# Patient Record
Sex: Female | Born: 1965 | Race: Black or African American | Hispanic: No | Marital: Married | Smoking: Never smoker
Health system: Southern US, Community
[De-identification: ages and names within clinical notes are randomized; demographics above are authoritative.]

## PROBLEM LIST (undated history)

## (undated) DIAGNOSIS — J302 Other seasonal allergic rhinitis: Secondary | ICD-10-CM

## (undated) DIAGNOSIS — Z808 Family history of malignant neoplasm of other organs or systems: Secondary | ICD-10-CM

## (undated) DIAGNOSIS — Z923 Personal history of irradiation: Secondary | ICD-10-CM

## (undated) DIAGNOSIS — Z803 Family history of malignant neoplasm of breast: Secondary | ICD-10-CM

## (undated) DIAGNOSIS — E0590 Thyrotoxicosis, unspecified without thyrotoxic crisis or storm: Secondary | ICD-10-CM

## (undated) DIAGNOSIS — Z8042 Family history of malignant neoplasm of prostate: Secondary | ICD-10-CM

## (undated) DIAGNOSIS — Z9889 Other specified postprocedural states: Secondary | ICD-10-CM

## (undated) DIAGNOSIS — R112 Nausea with vomiting, unspecified: Secondary | ICD-10-CM

## (undated) HISTORY — PX: COLPOSCOPY: SHX161

## (undated) HISTORY — DX: Family history of malignant neoplasm of other organs or systems: Z80.8

## (undated) HISTORY — DX: Other seasonal allergic rhinitis: J30.2

## (undated) HISTORY — PX: COLONOSCOPY: SHX174

## (undated) HISTORY — DX: Family history of malignant neoplasm of breast: Z80.3

## (undated) HISTORY — DX: Family history of malignant neoplasm of prostate: Z80.42

## (undated) HISTORY — DX: Thyrotoxicosis, unspecified without thyrotoxic crisis or storm: E05.90

---

## 1999-02-04 ENCOUNTER — Other Ambulatory Visit: Admission: RE | Admit: 1999-02-04 | Discharge: 1999-02-04 | Payer: Self-pay | Admitting: Obstetrics & Gynecology

## 2000-02-05 ENCOUNTER — Other Ambulatory Visit: Admission: RE | Admit: 2000-02-05 | Discharge: 2000-02-05 | Payer: Self-pay | Admitting: Obstetrics & Gynecology

## 2001-02-03 ENCOUNTER — Ambulatory Visit (HOSPITAL_COMMUNITY): Admission: RE | Admit: 2001-02-03 | Discharge: 2001-02-03 | Payer: Self-pay | Admitting: Obstetrics and Gynecology

## 2001-02-03 ENCOUNTER — Encounter: Payer: Self-pay | Admitting: Obstetrics and Gynecology

## 2001-03-21 ENCOUNTER — Ambulatory Visit (HOSPITAL_COMMUNITY): Admission: RE | Admit: 2001-03-21 | Discharge: 2001-03-21 | Payer: Self-pay | Admitting: Obstetrics and Gynecology

## 2001-03-21 ENCOUNTER — Encounter: Payer: Self-pay | Admitting: Obstetrics and Gynecology

## 2001-06-24 ENCOUNTER — Inpatient Hospital Stay (HOSPITAL_COMMUNITY): Admission: AD | Admit: 2001-06-24 | Discharge: 2001-06-26 | Payer: Self-pay | Admitting: Obstetrics & Gynecology

## 2001-07-17 ENCOUNTER — Other Ambulatory Visit: Admission: RE | Admit: 2001-07-17 | Discharge: 2001-07-17 | Payer: Self-pay | Admitting: Obstetrics & Gynecology

## 2002-07-18 ENCOUNTER — Other Ambulatory Visit: Admission: RE | Admit: 2002-07-18 | Discharge: 2002-07-18 | Payer: Self-pay | Admitting: Obstetrics & Gynecology

## 2003-08-14 ENCOUNTER — Other Ambulatory Visit: Admission: RE | Admit: 2003-08-14 | Discharge: 2003-08-14 | Payer: Self-pay | Admitting: Obstetrics & Gynecology

## 2004-08-17 ENCOUNTER — Other Ambulatory Visit: Admission: RE | Admit: 2004-08-17 | Discharge: 2004-08-17 | Payer: Self-pay | Admitting: Obstetrics & Gynecology

## 2006-08-07 ENCOUNTER — Emergency Department (HOSPITAL_COMMUNITY): Admission: EM | Admit: 2006-08-07 | Discharge: 2006-08-07 | Payer: Self-pay | Admitting: Emergency Medicine

## 2010-02-02 ENCOUNTER — Encounter: Payer: Self-pay | Admitting: Internal Medicine

## 2010-06-22 ENCOUNTER — Other Ambulatory Visit: Payer: Self-pay | Admitting: Internal Medicine

## 2010-06-22 DIAGNOSIS — Z1231 Encounter for screening mammogram for malignant neoplasm of breast: Secondary | ICD-10-CM

## 2010-07-06 ENCOUNTER — Ambulatory Visit
Admission: RE | Admit: 2010-07-06 | Discharge: 2010-07-06 | Disposition: A | Discharge status: Home / Self Care | Payer: 59 | Source: Ambulatory Visit | Attending: Internal Medicine | Admitting: Internal Medicine

## 2010-07-06 DIAGNOSIS — Z1231 Encounter for screening mammogram for malignant neoplasm of breast: Secondary | ICD-10-CM

## 2010-09-02 ENCOUNTER — Telehealth (INDEPENDENT_AMBULATORY_CARE_PROVIDER_SITE_OTHER): Payer: Self-pay

## 2010-10-02 NOTE — Telephone Encounter (Signed)
Encounter resolved.

## 2011-06-21 ENCOUNTER — Other Ambulatory Visit: Payer: Self-pay | Admitting: Internal Medicine

## 2011-06-21 DIAGNOSIS — Z1231 Encounter for screening mammogram for malignant neoplasm of breast: Secondary | ICD-10-CM

## 2011-07-12 ENCOUNTER — Ambulatory Visit
Admission: RE | Admit: 2011-07-12 | Discharge: 2011-07-12 | Disposition: A | Discharge status: Home / Self Care | Payer: 59 | Source: Ambulatory Visit | Attending: Internal Medicine | Admitting: Internal Medicine

## 2011-07-12 DIAGNOSIS — Z1231 Encounter for screening mammogram for malignant neoplasm of breast: Secondary | ICD-10-CM

## 2012-06-19 ENCOUNTER — Other Ambulatory Visit: Payer: Self-pay

## 2012-06-19 DIAGNOSIS — Z1231 Encounter for screening mammogram for malignant neoplasm of breast: Secondary | ICD-10-CM

## 2012-08-24 ENCOUNTER — Ambulatory Visit
Admission: RE | Admit: 2012-08-24 | Discharge: 2012-08-24 | Disposition: A | Discharge status: Home / Self Care | Payer: 59 | Source: Ambulatory Visit

## 2012-08-24 DIAGNOSIS — Z1231 Encounter for screening mammogram for malignant neoplasm of breast: Secondary | ICD-10-CM

## 2013-10-02 ENCOUNTER — Other Ambulatory Visit: Payer: Self-pay

## 2013-10-02 DIAGNOSIS — Z1231 Encounter for screening mammogram for malignant neoplasm of breast: Secondary | ICD-10-CM

## 2013-10-15 ENCOUNTER — Ambulatory Visit
Admission: RE | Admit: 2013-10-15 | Discharge: 2013-10-15 | Disposition: A | Discharge status: Home / Self Care | Payer: 59 | Source: Ambulatory Visit

## 2013-10-15 DIAGNOSIS — Z1231 Encounter for screening mammogram for malignant neoplasm of breast: Secondary | ICD-10-CM

## 2014-10-07 ENCOUNTER — Other Ambulatory Visit: Payer: Self-pay

## 2014-10-07 DIAGNOSIS — Z1231 Encounter for screening mammogram for malignant neoplasm of breast: Secondary | ICD-10-CM

## 2014-10-21 ENCOUNTER — Ambulatory Visit
Admission: RE | Admit: 2014-10-21 | Discharge: 2014-10-21 | Disposition: A | Discharge status: Home / Self Care | Payer: 59 | Source: Ambulatory Visit

## 2014-10-21 DIAGNOSIS — Z1231 Encounter for screening mammogram for malignant neoplasm of breast: Secondary | ICD-10-CM

## 2015-10-16 ENCOUNTER — Other Ambulatory Visit: Payer: Self-pay | Admitting: Internal Medicine

## 2015-10-16 DIAGNOSIS — Z1231 Encounter for screening mammogram for malignant neoplasm of breast: Secondary | ICD-10-CM

## 2015-10-28 ENCOUNTER — Ambulatory Visit
Admission: RE | Admit: 2015-10-28 | Discharge: 2015-10-28 | Disposition: A | Discharge status: Home / Self Care | Payer: Managed Care, Other (non HMO) | Source: Ambulatory Visit | Attending: Internal Medicine | Admitting: Internal Medicine

## 2015-10-28 DIAGNOSIS — Z1231 Encounter for screening mammogram for malignant neoplasm of breast: Secondary | ICD-10-CM

## 2016-10-21 ENCOUNTER — Other Ambulatory Visit: Payer: Self-pay | Admitting: Internal Medicine

## 2016-10-21 DIAGNOSIS — Z1231 Encounter for screening mammogram for malignant neoplasm of breast: Secondary | ICD-10-CM

## 2016-12-06 ENCOUNTER — Ambulatory Visit
Admission: RE | Admit: 2016-12-06 | Discharge: 2016-12-06 | Disposition: A | Discharge status: Home / Self Care | Payer: Managed Care, Other (non HMO) | Source: Ambulatory Visit | Attending: Internal Medicine | Admitting: Internal Medicine

## 2016-12-06 DIAGNOSIS — Z1231 Encounter for screening mammogram for malignant neoplasm of breast: Secondary | ICD-10-CM

## 2017-11-04 ENCOUNTER — Other Ambulatory Visit: Payer: Self-pay | Admitting: Internal Medicine

## 2017-11-04 DIAGNOSIS — Z1231 Encounter for screening mammogram for malignant neoplasm of breast: Secondary | ICD-10-CM

## 2017-12-26 ENCOUNTER — Ambulatory Visit
Admission: RE | Admit: 2017-12-26 | Discharge: 2017-12-26 | Disposition: A | Discharge status: Home / Self Care | Payer: Managed Care, Other (non HMO) | Source: Ambulatory Visit | Attending: Internal Medicine | Admitting: Internal Medicine

## 2017-12-26 DIAGNOSIS — Z1231 Encounter for screening mammogram for malignant neoplasm of breast: Secondary | ICD-10-CM

## 2018-01-30 ENCOUNTER — Encounter: Payer: Self-pay | Admitting: Internal Medicine

## 2018-01-30 ENCOUNTER — Ambulatory Visit: Payer: 59 | Admitting: Internal Medicine

## 2018-01-30 VITALS — BP 112/74 | HR 76 | Temp 97.7°F | Ht 67.0 in | Wt 197.6 lb

## 2018-01-30 DIAGNOSIS — Z0000 Encounter for general adult medical examination without abnormal findings: Secondary | ICD-10-CM | POA: Diagnosis not present

## 2018-01-30 LAB — CMP14+EGFR
ALBUMIN: 4.2 g/dL (ref 3.8–4.9)
ALT: 19 IU/L (ref 0–32)
AST: 15 IU/L (ref 0–40)
Albumin/Globulin Ratio: 1.6 (ref 1.2–2.2)
Alkaline Phosphatase: 86 IU/L (ref 39–117)
BUN / CREAT RATIO: 12 (ref 9–23)
BUN: 10 mg/dL (ref 6–24)
Bilirubin Total: 0.2 mg/dL (ref 0.0–1.2)
CALCIUM: 8.8 mg/dL (ref 8.7–10.2)
CO2: 21 mmol/L (ref 20–29)
CREATININE: 0.85 mg/dL (ref 0.57–1.00)
Chloride: 101 mmol/L (ref 96–106)
GFR calc non Af Amer: 80 mL/min/{1.73_m2} (ref >59)
GFR, EST AFRICAN AMERICAN: 92 mL/min/{1.73_m2} (ref >59)
GLOBULIN, TOTAL: 2.7 g/dL (ref 1.5–4.5)
Glucose: 81 mg/dL (ref 65–99)
Potassium: 3.9 mmol/L (ref 3.5–5.2)
SODIUM: 136 mmol/L (ref 134–144)
TOTAL PROTEIN: 6.9 g/dL (ref 6.0–8.5)

## 2018-01-30 LAB — CBC
Hematocrit: 37.8 % (ref 34.0–46.6)
Hemoglobin: 12.5 g/dL (ref 11.1–15.9)
MCH: 28 pg (ref 26.6–33.0)
MCHC: 33.1 g/dL (ref 31.5–35.7)
MCV: 85 fL (ref 79–97)
PLATELETS: 273 10*3/uL (ref 150–450)
RBC: 4.47 x10E6/uL (ref 3.77–5.28)
RDW: 12.5 % (ref 11.7–15.4)
WBC: 7.2 10*3/uL (ref 3.4–10.8)

## 2018-01-30 LAB — LIPID PANEL
Chol/HDL Ratio: 3 ratio (ref 0.0–4.4)
Cholesterol, Total: 210 mg/dL — ABNORMAL HIGH (ref 100–199)
HDL: 71 mg/dL (ref >39)
LDL Calculated: 126 mg/dL — ABNORMAL HIGH (ref 0–99)
Triglycerides: 63 mg/dL (ref 0–149)
VLDL CHOLESTEROL CAL: 13 mg/dL (ref 5–40)

## 2018-01-30 LAB — HEMOGLOBIN A1C
Est. average glucose Bld gHb Est-mCnc: 103 mg/dL
Hgb A1c MFr Bld: 5.2 % (ref 4.8–5.6)

## 2018-01-30 NOTE — Progress Notes (Signed)
Subjective:     Patient ID: Cassandra Torres , female    DOB: 27-Aug-1965 , 53 y.o.   MRN: [id number]   Chief Complaint  Patient presents with  . Annual Exam    HPI  She is here today for a full physical examination. Her last pap smear was in 2018. She has no specific concerns or complaints at this time.     Past Medical History:  Diagnosis Date  . Seasonal allergies   . Thyrotoxicosis      Family History  Problem Relation Age of Onset  . Hypertension Mother   . Healthy Father      Current Outpatient Medications:  .  Cholecalciferol (VITAMIN D) 50 MCG (2000 UT) CAPS, Take by mouth., Disp: , Rfl:  .  loratadine (CLARITIN) 10 MG tablet, Take by mouth., Disp: , Rfl:  .  hydroxychloroquine (PLAQUENIL) 200 MG tablet, Take by mouth., Disp: , Rfl:    Allergies  Allergen Reactions  . Penicillins Hives  . Sulfur Hives      Patient's last menstrual period was 01/23/2018..  Negative for: breast discharge, breast lump(s), breast pain and breast self exam. Associated symptoms include abnormal vaginal bleeding. Pertinent negatives include abnormal bleeding (hematology), anxiety, decreased libido, depression, difficulty falling sleep, dyspareunia, history of infertility, nocturia, sexual dysfunction, sleep disturbances, urinary incontinence, urinary urgency, vaginal discharge and vaginal itching. Diet regular.The patient states her exercise level is  minimal.  . The patient's tobacco use is:  Social History   Tobacco Use  Smoking Status Never Smoker  Smokeless Tobacco Never Used  . She has been exposed to passive smoke. The patient's alcohol use is:  Social History   Substance and Sexual Activity  Alcohol Use Yes   Comment: occasional  . Additional information: Last pap Dec 2018, next one scheduled for 2021.   Review of Systems  Constitutional: Negative.   HENT: Negative.   Eyes: Negative.   Respiratory: Negative.   Cardiovascular: Negative.   Gastrointestinal: Negative.    Endocrine: Negative.   Genitourinary: Negative.   Musculoskeletal: Negative.   Skin: Negative.   Allergic/Immunologic: Negative.   Neurological: Negative.   Hematological: Negative.   Psychiatric/Behavioral: Negative.      Today's Vitals   01/30/18 1006  BP: 112/74  Pulse: 76  Temp: 97.7 F (36.5 C)  TempSrc: Oral  Weight: 197 lb 9.6 oz (89.6 kg)  Height: 5' 7"  (1.702 m)   Body mass index is 30.95 kg/m.   Objective:  Physical Exam Vitals signs and nursing note reviewed.  Constitutional:      Appearance: Normal appearance. She is obese.  HENT:     Head: Normocephalic and atraumatic.     Right Ear: Tympanic membrane, ear canal and external ear normal.     Left Ear: Tympanic membrane, ear canal and external ear normal.     Nose: Nose normal.     Mouth/Throat:     Mouth: Mucous membranes are moist.     Pharynx: Oropharynx is clear.  Eyes:     Extraocular Movements: Extraocular movements intact.     Conjunctiva/sclera: Conjunctivae normal.     Pupils: Pupils are equal, round, and reactive to light.  Cardiovascular:     Rate and Rhythm: Normal rate and regular rhythm.     Pulses: Normal pulses.     Heart sounds: Normal heart sounds.  Pulmonary:     Effort: Pulmonary effort is normal.     Breath sounds: Normal breath sounds.  Chest:  Breasts:        Right: Normal. No swelling, bleeding, inverted nipple, mass, nipple discharge or skin change.        Left: Normal. No swelling, bleeding, inverted nipple, mass, nipple discharge or skin change.  Abdominal:     General: Bowel sounds are normal.     Palpations: Abdomen is soft.  Genitourinary:    Comments: deferred Musculoskeletal: Normal range of motion.  Skin:    General: Skin is warm and dry.     Capillary Refill: Capillary refill takes less than 2 seconds.  Neurological:     General: No focal deficit present.     Mental Status: She is alert.  Psychiatric:        Mood and Affect: Mood normal.          Assessment And Plan:     1. Routine general medical examination at health care facility  A full exam was performed.  Importance of monthly self breast exam was discussed with the patient. PATIENT HAS BEEN ADVISED TO GET 30-45 MINUTES REGULAR EXERCISE NO LESS THAN FOUR TO FIVE DAYS PER WEEK - BOTH WEIGHTBEARING EXERCISES AND AEROBIC ARE RECOMMENDED.  SHE IS ADVISED TO FOLLOW A HEALTHY DIET WITH AT LEAST SIX FRUITS/VEGGIES PER DAY, DECREASE INTAKE OF RED MEAT, AND TO INCREASE FISH INTAKE TO TWO DAYS PER WEEK.  MEATS/FISH SHOULD NOT BE FRIED, BAKED OR BROILED IS PREFERABLE.  I SUGGEST WEARING SPF 50 SUNSCREEN ON EXPOSED PARTS AND ESPECIALLY WHEN IN THE DIRECT SUNLIGHT FOR AN EXTENDED PERIOD OF TIME.  PLEASE AVOID FAST FOOD RESTAURANTS AND INCREASE YOUR WATER INTAKE.  - CMP14+EGFR - CBC - Lipid panel - Hemoglobin A1c        Maximino Greenland, MD

## 2018-01-30 NOTE — Patient Instructions (Signed)

## 2018-05-01 ENCOUNTER — Other Ambulatory Visit: Payer: Self-pay

## 2018-05-01 MED ORDER — HYDROXYCHLOROQUINE SULFATE 200 MG PO TABS: 200.0000 mg | ORAL_TABLET | Freq: Every day | ORAL | 0 refills | Status: DC

## 2018-05-02 ENCOUNTER — Other Ambulatory Visit: Payer: Self-pay

## 2018-05-02 MED ORDER — HYDROXYCHLOROQUINE SULFATE 200 MG PO TABS: ORAL_TABLET | ORAL | 0 refills | Status: DC

## 2018-08-04 ENCOUNTER — Other Ambulatory Visit: Payer: Self-pay | Admitting: Internal Medicine

## 2018-11-05 ENCOUNTER — Other Ambulatory Visit: Payer: Self-pay | Admitting: Internal Medicine

## 2018-12-12 ENCOUNTER — Other Ambulatory Visit: Payer: Self-pay | Admitting: Internal Medicine

## 2018-12-12 DIAGNOSIS — Z1231 Encounter for screening mammogram for malignant neoplasm of breast: Secondary | ICD-10-CM

## 2019-02-01 ENCOUNTER — Ambulatory Visit
Admission: RE | Admit: 2019-02-01 | Discharge: 2019-02-01 | Disposition: A | Discharge status: Home / Self Care | Payer: Managed Care, Other (non HMO) | Source: Ambulatory Visit | Attending: Internal Medicine | Admitting: Internal Medicine

## 2019-02-01 ENCOUNTER — Other Ambulatory Visit: Payer: Self-pay

## 2019-02-01 DIAGNOSIS — Z1231 Encounter for screening mammogram for malignant neoplasm of breast: Secondary | ICD-10-CM

## 2019-02-06 ENCOUNTER — Other Ambulatory Visit: Payer: Self-pay | Admitting: Internal Medicine

## 2019-02-06 DIAGNOSIS — R928 Other abnormal and inconclusive findings on diagnostic imaging of breast: Secondary | ICD-10-CM

## 2019-02-12 ENCOUNTER — Other Ambulatory Visit (HOSPITAL_COMMUNITY)
Admission: RE | Admit: 2019-02-12 | Discharge: 2019-02-12 | Disposition: A | Discharge status: Home / Self Care | Payer: Managed Care, Other (non HMO) | Source: Ambulatory Visit | Attending: Internal Medicine | Admitting: Internal Medicine

## 2019-02-12 ENCOUNTER — Ambulatory Visit: Payer: Managed Care, Other (non HMO) | Admitting: Internal Medicine

## 2019-02-12 ENCOUNTER — Encounter: Payer: Self-pay | Admitting: Internal Medicine

## 2019-02-12 ENCOUNTER — Other Ambulatory Visit: Payer: Self-pay

## 2019-02-12 ENCOUNTER — Ambulatory Visit: Payer: 59 | Admitting: Internal Medicine

## 2019-02-12 VITALS — BP 108/74 | HR 94 | Temp 98.5°F | Ht 67.4 in | Wt 200.2 lb

## 2019-02-12 DIAGNOSIS — R928 Other abnormal and inconclusive findings on diagnostic imaging of breast: Secondary | ICD-10-CM

## 2019-02-12 DIAGNOSIS — Z6830 Body mass index (BMI) 30.0-30.9, adult: Secondary | ICD-10-CM

## 2019-02-12 DIAGNOSIS — Z0000 Encounter for general adult medical examination without abnormal findings: Secondary | ICD-10-CM

## 2019-02-12 DIAGNOSIS — E6609 Other obesity due to excess calories: Secondary | ICD-10-CM

## 2019-02-12 DIAGNOSIS — R7989 Other specified abnormal findings of blood chemistry: Secondary | ICD-10-CM

## 2019-02-12 DIAGNOSIS — Z712 Person consulting for explanation of examination or test findings: Secondary | ICD-10-CM

## 2019-02-12 DIAGNOSIS — Z01419 Encounter for gynecological examination (general) (routine) without abnormal findings: Secondary | ICD-10-CM | POA: Insufficient documentation

## 2019-02-12 DIAGNOSIS — Z1211 Encounter for screening for malignant neoplasm of colon: Secondary | ICD-10-CM

## 2019-02-12 DIAGNOSIS — C801 Malignant (primary) neoplasm, unspecified: Secondary | ICD-10-CM

## 2019-02-12 HISTORY — DX: Malignant (primary) neoplasm, unspecified: C80.1

## 2019-02-12 LAB — POCT URINALYSIS DIPSTICK
Bilirubin, UA: NEGATIVE
Glucose, UA: NEGATIVE
Ketones, UA: NEGATIVE
Nitrite, UA: NEGATIVE
Protein, UA: NEGATIVE
Spec Grav, UA: >=1.03 — AB (ref 1.010–1.025)
Urobilinogen, UA: 0.2 E.U./dL
pH, UA: 5.5 (ref 5.0–8.0)

## 2019-02-12 LAB — POC HEMOCCULT BLD/STL (OFFICE/1-CARD/DIAGNOSTIC)
Card #1 Date: 2012021
Fecal Occult Blood, POC: NEGATIVE

## 2019-02-12 NOTE — Patient Instructions (Signed)
Health Maintenance, Female Adopting a healthy lifestyle and getting preventive care are important in promoting health and wellness. Ask your health care provider about:  The right schedule for you to have regular tests and exams.  Things you can do on your own to prevent diseases and keep yourself healthy. What should I know about diet, weight, and exercise? Eat a healthy diet   Eat a diet that includes plenty of vegetables, fruits, low-fat dairy products, and lean protein.  Do not eat a lot of foods that are high in solid fats, added sugars, or sodium. Maintain a healthy weight Body mass index (BMI) is used to identify weight problems. It estimates body fat based on height and weight. Your health care provider can help determine your BMI and help you achieve or maintain a healthy weight. Get regular exercise Get regular exercise. This is one of the most important things you can do for your health. Most adults should:  Exercise for at least 150 minutes each week. The exercise should increase your heart rate and make you sweat (moderate-intensity exercise).  Do strengthening exercises at least twice a week. This is in addition to the moderate-intensity exercise.  Spend less time sitting. Even light physical activity can be beneficial. Watch cholesterol and blood lipids Have your blood tested for lipids and cholesterol at 53 years of age, then have this test every 5 years. Have your cholesterol levels checked more often if:  Your lipid or cholesterol levels are high.  You are older than 53 years of age.  You are at high risk for heart disease. What should I know about cancer screening? Depending on your health history and family history, you may need to have cancer screening at various ages. This may include screening for:  Breast cancer.  Cervical cancer.  Colorectal cancer.  Skin cancer.  Lung cancer. What should I know about heart disease, diabetes, and high blood  pressure? Blood pressure and heart disease  High blood pressure causes heart disease and increases the risk of stroke. This is more likely to develop in people who have high blood pressure readings, are of African descent, or are overweight.  Have your blood pressure checked: ? Every 3-5 years if you are 18-39 years of age. ? Every year if you are 40 years old or older. Diabetes Have regular diabetes screenings. This checks your fasting blood sugar level. Have the screening done:  Once every three years after age 40 if you are at a normal weight and have a low risk for diabetes.  More often and at a younger age if you are overweight or have a high risk for diabetes. What should I know about preventing infection? Hepatitis B If you have a higher risk for hepatitis B, you should be screened for this virus. Talk with your health care provider to find out if you are at risk for hepatitis B infection. Hepatitis C Testing is recommended for:  Everyone born from 1945 through 1965.  Anyone with known risk factors for hepatitis C. Sexually transmitted infections (STIs)  Get screened for STIs, including gonorrhea and chlamydia, if: ? You are sexually active and are younger than 53 years of age. ? You are older than 53 years of age and your health care provider tells you that you are at risk for this type of infection. ? Your sexual activity has changed since you were last screened, and you are at increased risk for chlamydia or gonorrhea. Ask your health care provider if   you are at risk.  Ask your health care provider about whether you are at high risk for HIV. Your health care provider may recommend a prescription medicine to help prevent HIV infection. If you choose to take medicine to prevent HIV, you should first get tested for HIV. You should then be tested every 3 months for as long as you are taking the medicine. Pregnancy  If you are about to stop having your period (premenopausal) and  you may become pregnant, seek counseling before you get pregnant.  Take 400 to 800 micrograms (mcg) of folic acid every day if you become pregnant.  Ask for birth control (contraception) if you want to prevent pregnancy. Osteoporosis and menopause Osteoporosis is a disease in which the bones lose minerals and strength with aging. This can result in bone fractures. If you are 65 years old or older, or if you are at risk for osteoporosis and fractures, ask your health care provider if you should:  Be screened for bone loss.  Take a calcium or vitamin D supplement to lower your risk of fractures.  Be given hormone replacement therapy (HRT) to treat symptoms of menopause. Follow these instructions at home: Lifestyle  Do not use any products that contain nicotine or tobacco, such as cigarettes, e-cigarettes, and chewing tobacco. If you need help quitting, ask your health care provider.  Do not use street drugs.  Do not share needles.  Ask your health care provider for help if you need support or information about quitting drugs. Alcohol use  Do not drink alcohol if: ? Your health care provider tells you not to drink. ? You are pregnant, may be pregnant, or are planning to become pregnant.  If you drink alcohol: ? Limit how much you use to 0-1 drink a day. ? Limit intake if you are breastfeeding.  Be aware of how much alcohol is in your drink. In the [place] one drink equals one 12 oz bottle of beer (355 mL), one 5 oz glass of wine (148 mL), or one 1 oz glass of hard liquor (44 mL). General instructions  Schedule regular health, dental, and eye exams.  Stay current with your vaccines.  Tell your health care provider if: ? You often feel depressed. ? You have ever been abused or do not feel safe at home. Summary  Adopting a healthy lifestyle and getting preventive care are important in promoting health and wellness.  Follow your health care provider's instructions about healthy  diet, exercising, and getting tested or screened for diseases.  Follow your health care provider's instructions on monitoring your cholesterol and blood pressure. This information is not intended to replace advice given to you by your health care provider. Make sure you discuss any questions you have with your health care provider. Document Revised: 12/21/2017 Document Reviewed: 12/21/2017 Elsevier Patient Education  2020 [employer]  

## 2019-02-12 NOTE — Progress Notes (Addendum)
This visit occurred during the SARS-CoV-2 public health emergency.  Safety protocols were in place, including screening questions prior to the visit, additional usage of staff PPE, and extensive cleaning of exam room while observing appropriate contact time as indicated for disinfecting solutions.  Subjective:     Patient ID: Cassandra Torres , female    DOB: 04-21-1965 , 54 y.o.   MRN: [id number]   Chief Complaint  Patient presents with  . Annual Exam    HPI  She is here today for a full physical examination. She would like to have a pap smear today. She recently had mammogram which was abnormal. She is scheduled for diagnostic mammo and u/s later this week. She has not palpated any abnormalities.     Past Medical History:  Diagnosis Date  . Seasonal allergies   . Thyrotoxicosis      Family History  Problem Relation Age of Onset  . Hypertension Mother   . Healthy Father      Current Outpatient Medications:  .  hydroxychloroquine (PLAQUENIL) 200 MG tablet, TAKE 1 TABLET BY MOUTH DAILY DX: L56.8, Disp: 90 tablet, Rfl: 0 .  loratadine (CLARITIN) 10 MG tablet, Take by mouth., Disp: , Rfl:  .  Multiple Vitamin (MULTIVITAMINS PO), Take by mouth., Disp: , Rfl:    Allergies  Allergen Reactions  . Penicillins Hives  . Sulfur Hives     The patient states she uses none for birth control. Last LMP was No LMP recorded.. Negative for Dysmenorrhea Negative for: breast discharge, breast lump(s), breast pain and breast self exam. Associated symptoms include abnormal vaginal bleeding. Pertinent negatives include abnormal bleeding (hematology), anxiety, decreased libido, depression, difficulty falling sleep, dyspareunia, history of infertility, nocturia, sexual dysfunction, sleep disturbances, urinary incontinence, urinary urgency, vaginal discharge and vaginal itching. Diet regular.The patient states her exercise level is  minimal.   . The patient's tobacco use is:  Social History   Tobacco  Use  Smoking Status Never Smoker  Smokeless Tobacco Never Used  . She has been exposed to passive smoke. The patient's alcohol use is:  Social History   Substance and Sexual Activity  Alcohol Use Yes   Comment: occasional    Review of Systems  Constitutional: Negative.   HENT: Negative.   Eyes: Negative.   Respiratory: Negative.   Cardiovascular: Negative.   Endocrine: Negative.   Genitourinary: Negative.   Musculoskeletal: Negative.   Skin: Negative.   Allergic/Immunologic: Negative.   Neurological: Negative.   Hematological: Negative.   Psychiatric/Behavioral: Negative.      Today's Vitals   02/12/19 1012  BP: 108/74  Pulse: 94  Temp: 98.5 F (36.9 C)  TempSrc: Oral  Weight: 200 lb 3.2 oz (90.8 kg)  Height: 5' 7.4" (1.712 m)   Body mass index is 30.98 kg/m.   Objective:  Physical Exam Vitals and nursing note reviewed. Exam conducted with a chaperone present.  Constitutional:      Appearance: Normal appearance.  HENT:     Head: Normocephalic and atraumatic.     Right Ear: Tympanic membrane, ear canal and external ear normal.     Left Ear: Tympanic membrane, ear canal and external ear normal.     Nose:     Comments: Deferred, masked    Mouth/Throat:     Comments: Deferred, masked Eyes:     Extraocular Movements: Extraocular movements intact.     Conjunctiva/sclera: Conjunctivae normal.     Pupils: Pupils are equal, round, and reactive to light.  Cardiovascular:  Rate and Rhythm: Normal rate and regular rhythm.     Pulses: Normal pulses.          Dorsalis pedis pulses are 2+ on the right side and 2+ on the left side.     Heart sounds: Normal heart sounds.  Pulmonary:     Effort: Pulmonary effort is normal.     Breath sounds: Normal breath sounds.  Chest:     Breasts: Tanner Score is 5.        Right: Normal.        Left: Normal.     Comments: Dense breasts b/l; Questionable discrete masses on r Abdominal:     General: Abdomen is flat. Bowel  sounds are normal.     Palpations: Abdomen is soft.     Hernia: There is no hernia in the left inguinal area or right inguinal area.  Genitourinary:    General: Normal vulva.     Exam position: Lithotomy position.     Tanner stage (genital): 5.     Vagina: Normal.     Cervix: Normal.     Uterus: Normal.      Adnexa: Right adnexa normal and left adnexa normal.     Rectum: Normal. Guaiac result negative. No mass.  Musculoskeletal:        General: Normal range of motion.     Cervical back: Normal range of motion and neck supple.  Lymphadenopathy:     Lower Body: No right inguinal adenopathy. No left inguinal adenopathy.  Skin:    General: Skin is warm and dry.  Neurological:     General: No focal deficit present.     Mental Status: She is alert and oriented to person, place, and time.  Psychiatric:        Mood and Affect: Mood normal.        Behavior: Behavior normal.         Assessment And Plan:     1. Routine general medical examination at health care facility  A full exam was performed.  Importance of monthly self breast exams was discussed with the patient. PATIENT IS ADVISED TO GET 30-45 MINUTES REGULAR EXERCISE NO LESS THAN FOUR TO FIVE DAYS PER WEEK - BOTH WEIGHTBEARING EXERCISES AND AEROBIC ARE RECOMMENDED.  SHE IS ADVISED TO FOLLOW A HEALTHY DIET WITH AT LEAST SIX FRUITS/VEGGIES PER DAY, DECREASE INTAKE OF RED MEAT, AND TO INCREASE FISH INTAKE TO TWO DAYS PER WEEK.  MEATS/FISH SHOULD NOT BE FRIED, BAKED OR BROILED IS PREFERABLE.  I SUGGEST WEARING SPF 50 SUNSCREEN ON EXPOSED PARTS AND ESPECIALLY WHEN IN THE DIRECT SUNLIGHT FOR AN EXTENDED PERIOD OF TIME.  PLEASE AVOID FAST FOOD RESTAURANTS AND INCREASE YOUR WATER INTAKE.  - CMP14+EGFR - CBC - Lipid panel - TSH - POCT Urinalysis Dipstick (81002)  2. Cervical smear, as part of routine gynecological examination  Pap smear performed, stool is heme negative.   - Cytology -Pap Smear  3. Abnormal mammogram  Mammo  results reviewed. She will have u/s and diagnostic mammogram performed in the near future.  4. Abnormal thyroid blood test  I will place referral to Endo as requested. She was previously established with Dr. [name]. I will check TSh today and forward her results to Dr. [name].   - Ambulatory referral to Endocrinology  5. Class 1 obesity due to excess calories without serious comorbidity with body mass index (BMI) of 30.0 to 30.9 in adult  She is encouraged to strive for BMI less than 27  to decrease cardiac risk.   Maximino Greenland, MD    THE PATIENT IS ENCOURAGED TO PRACTICE SOCIAL DISTANCING DUE TO THE COVID-19 PANDEMIC.

## 2019-02-13 LAB — CMP14+EGFR
ALT: 18 IU/L (ref 0–32)
AST: 17 IU/L (ref 0–40)
Albumin/Globulin Ratio: 1.6 (ref 1.2–2.2)
Albumin: 4.3 g/dL (ref 3.8–4.9)
Alkaline Phosphatase: 99 IU/L (ref 39–117)
BUN/Creatinine Ratio: 12 (ref 9–23)
BUN: 10 mg/dL (ref 6–24)
Bilirubin Total: 0.3 mg/dL (ref 0.0–1.2)
CO2: 21 mmol/L (ref 20–29)
Calcium: 9 mg/dL (ref 8.7–10.2)
Chloride: 105 mmol/L (ref 96–106)
Creatinine, Ser: 0.84 mg/dL (ref 0.57–1.00)
GFR calc Af Amer: 92 mL/min/{1.73_m2} (ref >59)
GFR calc non Af Amer: 80 mL/min/{1.73_m2} (ref >59)
Globulin, Total: 2.7 g/dL (ref 1.5–4.5)
Glucose: 79 mg/dL (ref 65–99)
Potassium: 4.2 mmol/L (ref 3.5–5.2)
Sodium: 138 mmol/L (ref 134–144)
Total Protein: 7 g/dL (ref 6.0–8.5)

## 2019-02-13 LAB — TSH: TSH: 0.235 u[IU]/mL — ABNORMAL LOW (ref 0.450–4.500)

## 2019-02-13 LAB — CBC
Hematocrit: 40.8 % (ref 34.0–46.6)
Hemoglobin: 13.2 g/dL (ref 11.1–15.9)
MCH: 28.2 pg (ref 26.6–33.0)
MCHC: 32.4 g/dL (ref 31.5–35.7)
MCV: 87 fL (ref 79–97)
Platelets: 227 10*3/uL (ref 150–450)
RBC: 4.68 x10E6/uL (ref 3.77–5.28)
RDW: 12.8 % (ref 11.7–15.4)
WBC: 8.5 10*3/uL (ref 3.4–10.8)

## 2019-02-13 LAB — LIPID PANEL
Chol/HDL Ratio: 2.9 ratio (ref 0.0–4.4)
Cholesterol, Total: 203 mg/dL — ABNORMAL HIGH (ref 100–199)
HDL: 69 mg/dL (ref >39)
LDL Chol Calc (NIH): 123 mg/dL — ABNORMAL HIGH (ref 0–99)
Triglycerides: 59 mg/dL (ref 0–149)
VLDL Cholesterol Cal: 11 mg/dL (ref 5–40)

## 2019-02-14 ENCOUNTER — Other Ambulatory Visit: Payer: Self-pay | Admitting: Internal Medicine

## 2019-02-14 ENCOUNTER — Ambulatory Visit
Admission: RE | Admit: 2019-02-14 | Discharge: 2019-02-14 | Disposition: A | Discharge status: Home / Self Care | Payer: Managed Care, Other (non HMO) | Source: Ambulatory Visit | Attending: Internal Medicine | Admitting: Internal Medicine

## 2019-02-14 ENCOUNTER — Other Ambulatory Visit: Payer: Self-pay

## 2019-02-14 DIAGNOSIS — R928 Other abnormal and inconclusive findings on diagnostic imaging of breast: Secondary | ICD-10-CM

## 2019-02-14 DIAGNOSIS — N6310 Unspecified lump in the right breast, unspecified quadrant: Secondary | ICD-10-CM

## 2019-02-14 LAB — CYTOLOGY - PAP
Comment: NEGATIVE
Diagnosis: NEGATIVE
High risk HPV: NEGATIVE

## 2019-02-14 LAB — T3, FREE: T3, Free: 3.4 pg/mL (ref 2.0–4.4)

## 2019-02-14 LAB — SPECIMEN STATUS REPORT

## 2019-02-14 LAB — T4, FREE: Free T4: 1.35 ng/dL (ref 0.82–1.77)

## 2019-02-21 ENCOUNTER — Other Ambulatory Visit: Payer: Managed Care, Other (non HMO)

## 2019-02-27 ENCOUNTER — Ambulatory Visit
Admission: RE | Admit: 2019-02-27 | Discharge: 2019-02-27 | Disposition: A | Discharge status: Home / Self Care | Payer: Managed Care, Other (non HMO) | Source: Ambulatory Visit | Attending: Internal Medicine | Admitting: Internal Medicine

## 2019-02-27 ENCOUNTER — Other Ambulatory Visit: Payer: Self-pay

## 2019-02-27 DIAGNOSIS — N6310 Unspecified lump in the right breast, unspecified quadrant: Secondary | ICD-10-CM

## 2019-02-28 ENCOUNTER — Encounter: Payer: Self-pay | Admitting: *Deleted

## 2019-03-02 ENCOUNTER — Other Ambulatory Visit: Payer: Self-pay | Admitting: *Deleted

## 2019-03-02 DIAGNOSIS — C50511 Malignant neoplasm of lower-outer quadrant of right female breast: Secondary | ICD-10-CM | POA: Insufficient documentation

## 2019-03-06 ENCOUNTER — Telehealth: Payer: Self-pay

## 2019-03-06 NOTE — Progress Notes (Signed)
[place] NOTE  Patient Care Team: Glendale Chard, MD as PCP - General (Internal Medicine) Mauro Kaufmann, RN as Oncology Nurse Navigator Rockwell Germany, RN as Oncology Nurse Navigator Rolm Bookbinder, MD as Consulting Physician (General Surgery) Nicholas Lose, MD as Consulting Physician (Hematology and Oncology) Gery Pray, MD as Consulting Physician (Radiation Oncology)  CHIEF COMPLAINTS/PURPOSE OF CONSULTATION:  Newly diagnosed breast cancer  HISTORY OF PRESENTING ILLNESS:  Cassandra Torres 54 y.o. female is here because of recent diagnosis of invasive ductal carcinoma of the right breast. Screening mammogram on 02/01/19 showed right breast masses. Diagnostic mammogram and [place] on 02/14/19 showed a 1.2cm mass at the 7 o'clock position in the right breast 7cm from the nipple, and a 1.1cm mass at the 7 o'clock position 6cm from the nipple, spanning 2.3cm in total, with no evidence of right axillary adenopathy. Biopsy on 02/27/19 showed invasive ductal carcinoma, grade 3, HER-2 equivocal by IHC, negative by FISH, ER+ 90%, PR+ 20%, Ki67 2%. She presents to the clinic today for initial evaluation and discussion of treatment options.   I reviewed her records extensively and collaborated the history with the patient.  SUMMARY OF ONCOLOGIC HISTORY: Oncology History  Malignant neoplasm of lower-outer quadrant of right breast of female, estrogen receptor positive ([place])  03/02/2019 Initial Diagnosis   Screening mammogram showed right breast masses. Diagnostic mammogram and US showed a 1.2cm mass at the 7 o'clock position 7cm from the nipple, and a 1.1cm mass at the 7 o'clock position 6cm from the nipple, spanning 2.3cm in total, with no evidence of right axillary adenopathy. Biopsy showed IDC, grade 3, HER-2 equivocal by IHC, negative by FISH, ER+ 90%, PR+ 20%, Ki67 2%.      MEDICAL HISTORY:  Past Medical History:  Diagnosis Date  . Seasonal allergies   .  Thyrotoxicosis     SURGICAL HISTORY: No past surgical history on file.  SOCIAL HISTORY: Social History   Socioeconomic History  . Marital status: Married    Spouse name: Not on file  . Number of children: Not on file  . Years of education: Not on file  . Highest education level: Not on file  Occupational History  . Not on file  Tobacco Use  . Smoking status: Never Smoker  . Smokeless tobacco: Never Used  Substance and Sexual Activity  . Alcohol use: Yes    Comment: occasional  . Drug use: Never  . Sexual activity: Not on file  Other Topics Concern  . Not on file  Social History Narrative  . Not on file   Social Determinants of Health   Financial Resource Strain:   . Difficulty of Paying Living Expenses: Not on file  Food Insecurity:   . Worried About Charity fundraiser in the Last Year: Not on file  . Ran Out of Food in the Last Year: Not on file  Transportation Needs:   . Lack of Transportation (Medical): Not on file  . Lack of Transportation (Non-Medical): Not on file  Physical Activity:   . Days of Exercise per Week: Not on file  . Minutes of Exercise per Session: Not on file  Stress:   . Feeling of Stress : Not on file  Social Connections:   . Frequency of Communication with Friends and Family: Not on file  . Frequency of Social Gatherings with Friends and Family: Not on file  . Attends Religious Services: Not on file  . Active Member of Clubs or Organizations: Not  on file  . Attends Archivist Meetings: Not on file  . Marital Status: Not on file  Intimate Partner Violence:   . Fear of Current or Ex-Partner: Not on file  . Emotionally Abused: Not on file  . Physically Abused: Not on file  . Sexually Abused: Not on file    FAMILY HISTORY: Family History  Problem Relation Age of Onset  . Hypertension Mother   . Healthy Father   . Prostate cancer Maternal Grandfather   . Breast cancer Paternal Aunt     ALLERGIES:  is allergic to levaquin  [levofloxacin]; penicillins; and sulfur.  MEDICATIONS:  Current Outpatient Medications  Medication Sig Dispense Refill  . diphenhydrAMINE (BENADRYL) 25 mg capsule Take 25 mg by mouth as needed.    Marland Kitchen ELDERBERRY PO Take 50 mg by mouth daily.    . hydroxychloroquine (PLAQUENIL) 200 MG tablet TAKE 1 TABLET BY MOUTH DAILY DX: L56.8 90 tablet 0  . loratadine (CLARITIN) 10 MG tablet Take by mouth.    . Multiple Vitamin (MULTIVITAMINS PO) Take by mouth.     No current facility-administered medications for this visit.    REVIEW OF SYSTEMS:   Constitutional: Denies fevers, chills or abnormal night sweats Eyes: Denies blurriness of vision, double vision or watery eyes Ears, nose, mouth, throat, and face: Denies mucositis or sore throat Respiratory: Denies cough, dyspnea or wheezes Cardiovascular: Denies palpitation, chest discomfort or lower extremity swelling Gastrointestinal:  Denies nausea, heartburn or change in bowel habits Skin: Denies abnormal skin rashes Lymphatics: Denies new lymphadenopathy or easy bruising Neurological:Denies numbness, tingling or new weaknesses Behavioral/Psych: Mood is stable, no new changes  Breast: Denies any palpable lumps or discharge All other systems were reviewed with the patient and are negative.  PHYSICAL EXAMINATION: ECOG PERFORMANCE STATUS: 1 - Symptomatic but completely ambulatory  Vitals:   03/07/19 1303  BP: 132/88  Pulse: 88  Resp: 20  Temp: 98.3 F (36.8 C)  SpO2: 100%   Filed Weights   03/07/19 1303  Weight: 198 lb 1.6 oz (89.9 kg)    GENERAL:alert, no distress and comfortable SKIN: skin color, texture, turgor are normal, no rashes or significant lesions EYES: normal, conjunctiva are pink and non-injected, sclera clear OROPHARYNX:no exudate, no erythema and lips, buccal mucosa, and tongue normal  NECK: supple, thyroid normal size, non-tender, without nodularity LYMPH:  no palpable lymphadenopathy in the cervical, axillary or  inguinal LUNGS: clear to auscultation and percussion with normal breathing effort HEART: regular rate & rhythm and no murmurs and no lower extremity edema ABDOMEN:abdomen soft, non-tender and normal bowel sounds Musculoskeletal:no cyanosis of digits and no clubbing  PSYCH: alert & oriented x 3 with fluent speech NEURO: no focal motor/sensory deficits BREAST: No palpable nodules in breast. No palpable axillary or supraclavicular lymphadenopathy (exam performed in the presence of a chaperone)   LABORATORY DATA:  I have reviewed the data as listed Lab Results  Component Value Date   WBC 8.3 03/07/2019   HGB 13.2 03/07/2019   HCT 40.8 03/07/2019   MCV 88.5 03/07/2019   PLT 227 03/07/2019   Lab Results  Component Value Date   NA 141 03/07/2019   K 4.0 03/07/2019   CL 105 03/07/2019   CO2 25 03/07/2019    RADIOGRAPHIC STUDIES: I have personally reviewed the radiological reports and agreed with the findings in the report.  ASSESSMENT AND PLAN:  Malignant neoplasm of lower-outer quadrant of right breast of female, estrogen receptor positive ([place]) 03/02/2019:Screening mammogram showed right  breast masses. Diagnostic mammogram and US showed a 1.2cm mass at the 7 o'clock position 7cm from the nipple, and a 1.1cm mass at the 7 o'clock position 6cm from the nipple, spanning 2.3cm in total, with no evidence of right axillary adenopathy. Biopsy showed IDC, grade 3, HER-2 equivocal by IHC, negative by FISH, ER+ 90%, PR+ 20%, Ki67 2%.   Pathology and radiology counseling:Discussed with the patient, the details of pathology including the type of breast cancer,the clinical staging, the significance of ER, PR and HER-2/neu receptors and the implications for treatment. After reviewing the pathology in detail, we proceeded to discuss the different treatment options between surgery, radiation, chemotherapy, antiestrogen therapies.  Recommendations: 1. Breast conserving surgery followed by 2. Oncotype  DX testing to determine if chemotherapy would be of any benefit followed by 3. Adjuvant radiation therapy followed by 4. Adjuvant antiestrogen therapy 5.  Genetic testing  Oncotype counseling: I discussed Oncotype DX test. I explained to the patient that this is a 21 gene panel to evaluate patient tumors DNA to calculate recurrence score. This would help determine whether patient has high risk or intermediate risk or low risk breast cancer. She understands that if her tumor was found to be high risk, she would benefit from systemic chemotherapy. If low risk, no need of chemotherapy. If she was found to be intermediate risk, we would need to evaluate the score as well as other risk factors and determine if an abbreviated chemotherapy may be of benefit.  Return to clinic after surgery to discuss final pathology report and then determine if Oncotype DX testing will need to be sent.     All questions were answered. The patient knows to call the clinic with any problems, questions or concerns.   Rulon Eisenmenger, MD, MPH 03/07/2019    I, Molly Dorshimer, am acting as scribe for Nicholas Lose, MD.  I have reviewed the above documentation for accuracy and completeness, and I agree with the above.

## 2019-03-06 NOTE — Telephone Encounter (Signed)
The pt was notified that Dr. Baird Cancer said thank you for the update and that the pt would need to contact her insurance company to see if she needed a referral.  The pt said that she was making sure because the breast center said that she didn't.

## 2019-03-07 ENCOUNTER — Encounter: Payer: Self-pay | Admitting: Hematology and Oncology

## 2019-03-07 ENCOUNTER — Encounter: Payer: Self-pay | Admitting: Physical Therapy

## 2019-03-07 ENCOUNTER — Other Ambulatory Visit: Payer: Self-pay

## 2019-03-07 ENCOUNTER — Ambulatory Visit: Payer: Managed Care, Other (non HMO) | Attending: General Surgery | Admitting: Physical Therapy

## 2019-03-07 ENCOUNTER — Ambulatory Visit (HOSPITAL_BASED_OUTPATIENT_CLINIC_OR_DEPARTMENT_OTHER): Payer: Managed Care, Other (non HMO) | Admitting: Licensed Clinical Social Worker

## 2019-03-07 ENCOUNTER — Inpatient Hospital Stay: Payer: Managed Care, Other (non HMO)

## 2019-03-07 ENCOUNTER — Encounter: Payer: Self-pay | Admitting: Licensed Clinical Social Worker

## 2019-03-07 ENCOUNTER — Other Ambulatory Visit: Payer: Self-pay | Admitting: General Surgery

## 2019-03-07 ENCOUNTER — Encounter (INDEPENDENT_AMBULATORY_CARE_PROVIDER_SITE_OTHER): Payer: Self-pay

## 2019-03-07 ENCOUNTER — Inpatient Hospital Stay (HOSPITAL_BASED_OUTPATIENT_CLINIC_OR_DEPARTMENT_OTHER): Payer: Managed Care, Other (non HMO) | Admitting: Hematology and Oncology

## 2019-03-07 ENCOUNTER — Ambulatory Visit
Admission: RE | Admit: 2019-03-07 | Discharge: 2019-03-07 | Disposition: A | Discharge status: Home / Self Care | Payer: Managed Care, Other (non HMO) | Source: Ambulatory Visit | Attending: Radiation Oncology | Admitting: Radiation Oncology

## 2019-03-07 DIAGNOSIS — Z803 Family history of malignant neoplasm of breast: Secondary | ICD-10-CM | POA: Insufficient documentation

## 2019-03-07 DIAGNOSIS — C50511 Malignant neoplasm of lower-outer quadrant of right female breast: Secondary | ICD-10-CM

## 2019-03-07 DIAGNOSIS — R293 Abnormal posture: Secondary | ICD-10-CM | POA: Diagnosis present

## 2019-03-07 DIAGNOSIS — Z8042 Family history of malignant neoplasm of prostate: Secondary | ICD-10-CM

## 2019-03-07 DIAGNOSIS — Z170 Estrogen receptor positive status [ER+]: Secondary | ICD-10-CM | POA: Insufficient documentation

## 2019-03-07 DIAGNOSIS — Z79899 Other long term (current) drug therapy: Secondary | ICD-10-CM

## 2019-03-07 DIAGNOSIS — Z808 Family history of malignant neoplasm of other organs or systems: Secondary | ICD-10-CM | POA: Diagnosis not present

## 2019-03-07 DIAGNOSIS — Z8249 Family history of ischemic heart disease and other diseases of the circulatory system: Secondary | ICD-10-CM | POA: Insufficient documentation

## 2019-03-07 LAB — CMP (CANCER CENTER ONLY)
ALT: 26 U/L (ref 0–44)
AST: 20 U/L (ref 15–41)
Albumin: 4.2 g/dL (ref 3.5–5.0)
Alkaline Phosphatase: 97 U/L (ref 38–126)
Anion gap: 11 (ref 5–15)
BUN: 9 mg/dL (ref 6–20)
CO2: 25 mmol/L (ref 22–32)
Calcium: 9.3 mg/dL (ref 8.9–10.3)
Chloride: 105 mmol/L (ref 98–111)
Creatinine: 0.82 mg/dL (ref 0.44–1.00)
GFR, Est AFR Am: >60 mL/min (ref >60)
GFR, Estimated: >60 mL/min (ref >60)
Glucose, Bld: 90 mg/dL (ref 70–99)
Potassium: 4 mmol/L (ref 3.5–5.1)
Sodium: 141 mmol/L (ref 135–145)
Total Bilirubin: 0.2 mg/dL — ABNORMAL LOW (ref 0.3–1.2)
Total Protein: 8 g/dL (ref 6.5–8.1)

## 2019-03-07 LAB — CBC WITH DIFFERENTIAL (CANCER CENTER ONLY)
Abs Immature Granulocytes: 0.02 10*3/uL (ref 0.00–0.07)
Basophils Absolute: 0 10*3/uL (ref 0.0–0.1)
Basophils Relative: 0 %
Eosinophils Absolute: 0.1 10*3/uL (ref 0.0–0.5)
Eosinophils Relative: 2 %
HCT: 40.8 % (ref 36.0–46.0)
Hemoglobin: 13.2 g/dL (ref 12.0–15.0)
Immature Granulocytes: 0 %
Lymphocytes Relative: 29 %
Lymphs Abs: 2.4 10*3/uL (ref 0.7–4.0)
MCH: 28.6 pg (ref 26.0–34.0)
MCHC: 32.4 g/dL (ref 30.0–36.0)
MCV: 88.5 fL (ref 80.0–100.0)
Monocytes Absolute: 0.4 10*3/uL (ref 0.1–1.0)
Monocytes Relative: 5 %
Neutro Abs: 5.3 10*3/uL (ref 1.7–7.7)
Neutrophils Relative %: 64 %
Platelet Count: 227 10*3/uL (ref 150–400)
RBC: 4.61 MIL/uL (ref 3.87–5.11)
RDW: 12.6 % (ref 11.5–15.5)
WBC Count: 8.3 10*3/uL (ref 4.0–10.5)
nRBC: 0 % (ref 0.0–0.2)

## 2019-03-07 LAB — GENETIC SCREENING ORDER

## 2019-03-07 NOTE — Patient Instructions (Signed)

## 2019-03-07 NOTE — Therapy (Signed)
[place], [place], [address] Phone: [phone]   Fax:  [phone]  Physical Therapy Evaluation  Patient Details  Name: Cassandra Torres MRN: [id number] Date of Birth: 05-28-1965 Referring Provider (PT): Dr. Rolm Bookbinder   Encounter Date: 03/07/2019  PT End of Session - 03/07/19 1341    Visit Number  1    Number of Visits  2    Date for PT Re-Evaluation  05/02/19    PT Start Time  3832    PT Stop Time  1400   Also saw pt from 1426-1435 for a total of 27 minutes   PT Time Calculation (min)  18 min    Activity Tolerance  Patient tolerated treatment well    Behavior During Therapy  [facility] for tasks assessed/performed       Past Medical History:  Diagnosis Date  . Family history of brain cancer   . Family history of breast cancer   . Family history of prostate cancer   . Seasonal allergies   . Thyrotoxicosis     History reviewed. No pertinent surgical history.  There were no vitals filed for this visit.   Subjective Assessment - 03/07/19 1330    Subjective  Patient reports she is here today to be seen by her medical team for her newly diagnosed right breast cancer.    Pertinent History  Patient was diagnosed on 02/01/2019 with right grade III invasive ductal carcinoma breast cancer. There are 2 masses in the lower outer quadrant measuring 1.1 cm and 1.2 cm. It is ER/PR positive and HER2 negative with a Ki67 of 2%.    Patient Stated Goals  Reduce lymphedema risk and learn post op shoulder ROM HEP    Currently in Pain?  No/denies         [facility] PT Assessment - 03/07/19 0001      Assessment   Medical Diagnosis  Right breast cancer    Referring Provider (PT)  Dr. Rolm Bookbinder    Onset Date/Surgical Date  02/01/19    Hand Dominance  Left    Prior Therapy  none      Precautions   Precautions  Other (comment)    Precaution Comments  active cancer      Restrictions   Weight Bearing  Restrictions  No      Balance Screen   Has the patient fallen in the past 6 months  No    Has the patient had a decrease in activity level because of a fear of falling?   No    Is the patient reluctant to leave their home because of a fear of falling?   No      Home Environment   Living Environment  Private residence    Living Arrangements  Spouse/significant other;Children   Husband and 77 y.o. 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    Leisure  She does not exercise      Cognition   Overall Cognitive Status  Within Functional Limits for tasks assessed      Posture/Postural Control   Posture/Postural Control  Postural limitations    Postural Limitations  Rounded Shoulders;Forward head      ROM / Strength   AROM / PROM / Strength  AROM;Strength  AROM   Overall AROM Comments  Cervical AROM is WNL    AROM Assessment Site  Shoulder    Right/Left Shoulder  Right;Left    Right Shoulder Extension  56 Degrees    Right Shoulder Flexion  140 Degrees    Right Shoulder ABduction  153 Degrees    Right Shoulder Internal Rotation  68 Degrees    Right Shoulder External Rotation  80 Degrees    Left Shoulder Extension  59 Degrees    Left Shoulder Flexion  150 Degrees    Left Shoulder ABduction  162 Degrees    Left Shoulder Internal Rotation  80 Degrees    Left Shoulder External Rotation  90 Degrees      Strength   Overall Strength  Within functional limits for tasks performed        LYMPHEDEMA/ONCOLOGY QUESTIONNAIRE - 03/07/19 1338      Type   Cancer Type  Right breast cancer      Lymphedema Assessments   Lymphedema Assessments  Upper extremities      Right Upper Extremity Lymphedema   10 cm Proximal to Olecranon Process  30.3 cm    Olecranon Process  27 cm    10 cm Proximal to Ulnar Styloid Process  22.2 cm     Just Proximal to Ulnar Styloid Process  16.4 cm    Across Hand at [employer]  18.8 cm    At [place] of 2nd Digit  6.1 cm      Left Upper Extremity Lymphedema   10 cm Proximal to Olecranon Process  30.3 cm    Olecranon Process  27 cm    10 cm Proximal to Ulnar Styloid Process  21.8 cm    Just Proximal to Ulnar Styloid Process  16.7 cm    Across Hand at [employer]  18.4 cm    At [place] of 2nd Digit  6.1 cm          Quick Dash - 03/07/19 0001    Open a tight or new jar  No difficulty    Do heavy household chores (wash walls, wash floors)  Unable    Carry a shopping bag or briefcase  Unable    Wash your back  No difficulty    Use a knife to cut food  No difficulty    Recreational activities in which you take some force or impact through your arm, shoulder, or hand (golf, hammering, tennis)  Unable    During the past week, to what extent has your arm, shoulder or hand problem interfered with your normal social activities with family, friends, neighbors, or groups?  Not at all    During the past week, to what extent has your arm, shoulder or hand problem limited your work or other regular daily activities  Slightly    Arm, shoulder, or hand pain.  None    Tingling (pins and needles) in your arm, shoulder, or hand  None    Difficulty Sleeping  No difficulty    DASH Score  29.55 %        Objective measurements completed on examination: See above findings.          Patient was instructed today in a home exercise program today for post op shoulder range of motion. These included active assist shoulder flexion in sitting, scapular retraction, wall walking with shoulder abduction, and hands behind head external rotation.  She was encouraged to do these twice a day, holding 3  seconds and repeating 5 times when permitted by her physician.        PT Education - 03/07/19 1339    Education Details  Lymphedema risk reduction and post op shoulder ROM HEP    Person(s) Educated   Patient    Methods  Demonstration;Handout    Comprehension  Verbalized understanding;Returned demonstration          PT Long Term Goals - 03/07/19 1609      PT LONG TERM GOAL #1   Title  Patient will demonstrate she has regained full shoulder ROM and function post operatively compared to baselines.    Time  8    Period  Weeks    Status  New    Target Date  05/02/19      Breast Clinic Goals - 03/07/19 1609      Patient will be able to verbalize understanding of pertinent lymphedema risk reduction practices relevant to her diagnosis specifically related to skin care.   Time  1    Period  Days    Status  Achieved      Patient will be able to return demonstrate and/or verbalize understanding of the post-op home exercise program related to regaining shoulder range of motion.   Time  1    Period  Days    Status  Achieved      Patient will be able to verbalize understanding of the importance of attending the postoperative After Breast Cancer Class for further lymphedema risk reduction education and therapeutic exercise.   Time  1    Period  Days    Status  Achieved            Plan - 03/07/19 1341    Clinical Impression Statement  Patient was diagnosed on 02/01/2019 with right grade III invasive ductal carcinoma breast cancer. There are 2 masses in the lower outer quadrant measuring 1.1 cm and 1.2 cm. It is ER/PR positive and HER2 negative with a Ki67 of 2%. Her multidisiplinary medical team met prior to her assessments. She is planning to have a right lumpectomy and sentinel node biopsy followed by radiation and anti-estrogen therapy. She will benefit from a post op PT visit to determine needs.    Stability/Clinical Decision Making  Stable/Uncomplicated    Clinical Decision Making  Low    Rehab Potential  Excellent    PT Frequency  --   Eval and 1 f/u visit   PT Treatment/Interventions  ADLs/Self Care Home Management;Therapeutic exercise;Patient/family education    PT Next  Visit Plan  Will reassess 3-4 weeks post op to determine needs    PT Home Exercise Plan  Post op shoulder ROM HEP    Consulted and Agree with Plan of Care  Patient;Family member/caregiver    Family Member Consulted  Husband       Patient will benefit from skilled therapeutic intervention in order to improve the following deficits and impairments:  Postural dysfunction, Decreased range of motion, Pain, Impaired UE functional use, Decreased knowledge of precautions  Visit Diagnosis: Malignant neoplasm of lower-outer quadrant of right breast of female, estrogen receptor positive ([place]) - Plan: PT plan of care cert/re-cert  Abnormal posture - Plan: PT plan of care cert/re-cert   Patient will follow up at outpatient cancer rehab 3-4 weeks following surgery.  If the patient requires physical therapy at that time, a specific plan will be dictated and sent to the referring physician for approval. The patient was educated today on appropriate basic range  of motion exercises to begin post operatively and the importance of attending the After Breast Cancer class following surgery.  Patient was educated today on lymphedema risk reduction practices as it pertains to recommendations that will benefit the patient immediately following surgery.  She verbalized good understanding.     Problem List Patient Active Problem List   Diagnosis Date Noted  . Family history of breast cancer   . Family history of prostate cancer   . Family history of brain cancer   . Malignant neoplasm of lower-outer quadrant of right breast of female, estrogen receptor positive ([place]) 03/02/2019   Annia Friendly, PT 03/07/19 4:14 PM   [place], [place], [address] Phone: [phone]   Fax:  [phone]  Name: Cassandra Torres MRN: [id number] Date of Birth: 01-Jul-1965

## 2019-03-07 NOTE — Progress Notes (Signed)
REFERRING PROVIDER: Nicholas Lose, MD [address] [place],  [place] [address]  PRIMARY PROVIDER:  Glendale Chard, MD  PRIMARY REASON FOR VISIT:  1. Family history of breast cancer   2. Family history of prostate cancer   3. Family history of brain cancer     I connected with Cassandra Torres on 03/07/2019 at 3:00 PM EDT by Webex and verified that I am speaking with the correct person using two identifiers.    Patient location: [facility] Provider location: clinic  HISTORY OF PRESENT ILLNESS:   Cassandra Torres, a 54 y.o. female, was seen for a [place] cancer genetics consultation at the request of Dr. Lindi Adie due to a personal and family history of cancer.  Cassandra Torres presents to clinic today to discuss the possibility of a hereditary predisposition to cancer, genetic testing, and to further clarify her future cancer risks, as well as potential cancer risks for family members.   In 2021, at the age of 69, Cassandra Torres was diagnosed with IDC of the right breast, ER/PR+, Her2-. The treatment plan includes breast conserving surgery, Oncotype DX to determine if chemotherapy would be beneficial, adjuvant radiation and adjuvant antiestrogen therapy.     CANCER HISTORY:  Oncology History  Malignant neoplasm of lower-outer quadrant of right breast of female, estrogen receptor positive ([place])  03/02/2019 Initial Diagnosis   Screening mammogram showed right breast masses. Diagnostic mammogram and US showed a 1.2cm mass at the 7 o'clock position 7cm from the nipple, and a 1.1cm mass at the 7 o'clock position 6cm from the nipple, spanning 2.3cm in total, with no evidence of right axillary adenopathy. Biopsy showed IDC, grade 3, HER-2 equivocal by IHC, negative by FISH, ER+ 90%, PR+ 20%, Ki67 2%.       RISK FACTORS:  Menarche was at age 39.  First live birth at age 16.  OCP use for approximately 3 years.  Ovaries intact: yes.  Hysterectomy: no.  Menopausal status: premenopausal. HRT use: 0  years. Colonoscopy: yes; patient reports 1 polyp removed. Mammogram within the last year: yes. Number of breast biopsies: 1. Up to date with pelvic exams: yes. Any excessive radiation exposure in the past: no  Past Medical History:  Diagnosis Date  . Family history of brain cancer   . Family history of breast cancer   . Family history of prostate cancer   . Seasonal allergies   . Thyrotoxicosis     No past surgical history on file.  Social History   Socioeconomic History  . Marital status: Married    Spouse name: Not on file  . Number of children: Not on file  . Years of education: Not on file  . Highest education level: Not on file  Occupational History  . Not on file  Tobacco Use  . Smoking status: Never Smoker  . Smokeless tobacco: Never Used  Substance and Sexual Activity  . Alcohol use: Yes    Comment: occasional  . Drug use: Never  . Sexual activity: Not on file  Other Topics Concern  . Not on file  Social History Narrative  . Not on file   Social Determinants of Health   Financial Resource Strain:   . Difficulty of Paying Living Expenses: Not on file  Food Insecurity:   . Worried About Charity fundraiser in the Last Year: Not on file  . Ran Out of Food in the Last Year: Not on file  Transportation Needs:   . Lack of Transportation (Medical):  Not on file  . Lack of Transportation (Non-Medical): Not on file  Physical Activity:   . Days of Exercise per Week: Not on file  . Minutes of Exercise per Session: Not on file  Stress:   . Feeling of Stress : Not on file  Social Connections:   . Frequency of Communication with Friends and Family: Not on file  . Frequency of Social Gatherings with Friends and Family: Not on file  . Attends Religious Services: Not on file  . Active Member of Clubs or Organizations: Not on file  . Attends Archivist Meetings: Not on file  . Marital Status: Not on file     FAMILY HISTORY:  We obtained a detailed,  4-generation family history.  Significant diagnoses are listed below: Family History  Problem Relation Age of Onset  . Hypertension Mother   . Healthy Father   . Prostate cancer Maternal Grandfather        dx 34s  . Breast cancer Paternal Aunt        dx 56s  . Brain cancer Maternal Grandmother        d. 68   Cassandra Torres has one son, 29, no history of cancer. She has 3 maternal half sisters, all living with no history of cancer. She has 2 paternal half sisters, she does not have good health history for them.   Cassandra Torres mother is living at 67 with no history of cancer. Patient had 3 maternal aunts and 2 maternal uncles, no cancers. No known cancers in maternal cousins. Maternal grandmother had brain cancer and died at 37. Maternal grandfather had prostate cancer and died at 57. This grandfather had a sister who had colon cancer in her 58s.  Cassandra Torres father is living at 40, no cancer history. Patient has 2 paternal aunts, 3 paternal uncles. One of her paternal aunts had breast cancer in her 62s and was diagnosed with breast cancer for a second time as well. No genetic testing. She is living in her 9s. No known cancers in paternal cousins. Paternal grandparents both passed in their 33s.   Cassandra Torres is unaware of previous family history of genetic testing for hereditary cancer risks. Patient's ancestors are of [place] American and Native American descent. There is no reported Ashkenazi Jewish ancestry. There is no known consanguinity.  GENETIC COUNSELING ASSESSMENT: Cassandra Torres is a 54 y.o. female with a personal and family history of breast cancer which is somewhat suggestive of a hereditary cancer syndrome such as HBOC and predisposition to cancer. We, therefore, discussed and recommended the following at today's visit.   DISCUSSION: We discussed that 5 - 10% of breast cancer is hereditary, with most cases associated with BRCA1/BRCA2 mutations.  There are other genes that can be associated with  hereditary breast cancer syndromes.  We discussed that testing is beneficial for several reasons including surgical decision-making for breast cancer, knowing how to follow individuals after completing their treatment, and understand if other family members could be at risk for cancer and allow them to undergo genetic testing.   We reviewed the characteristics, features and inheritance patterns of hereditary cancer syndromes. We also discussed genetic testing, including the appropriate family members to test, the process of testing, insurance coverage and turn-around-time for results. We discussed the implications of a negative, positive and/or variant of uncertain significant result. In order to get genetic test results in a timely manner so that Cassandra Torres can use these genetic test results for surgical  decisions, we recommended Cassandra Torres pursue genetic testing for the [facility] Breast Cancer STAT Panel. Once complete, we recommend Cassandra Torres pursue reflex genetic testing to the Common Hereditary Cancers gene panel.   The STAT Breast cancer panel offered by [facility] includes sequencing and rearrangement analysis for the following 9 genes:  ATM, BRCA1, BRCA2, CDH1, CHEK2, PALB2, PTEN, STK11 and TP53.    The Common Hereditary Cancers Panel offered by [facility] includes sequencing and/or deletion duplication testing of the following 48 genes: APC, ATM, AXIN2, BARD1, BMPR1A, BRCA1, BRCA2, BRIP1, CDH1, CDKN2A (p14ARF), CDKN2A (p16INK4a), CKD4, CHEK2, CTNNA1, DICER1, EPCAM (Deletion/duplication testing only), GREM1 (promoter region deletion/duplication testing only), KIT, MEN1, MLH1, MSH2, MSH3, MSH6, MUTYH, NBN, NF1, NHTL1, PALB2, PDGFRA, PMS2, POLD1, POLE, PTEN, RAD50, RAD51C, RAD51D, RNF43, SDHB, SDHC, SDHD, SMAD4, SMARCA4. STK11, TP53, TSC1, TSC2, and VHL.  The following genes were evaluated for sequence changes only: SDHA and HOXB13 c.251G>A variant only.  Based on Cassandra Torres's personal and family history of  cancer, she meets medical criteria for genetic testing. Despite that she meets criteria, she may still have an out of pocket cost.   PLAN: After considering the risks, benefits, and limitations, Cassandra Torres provided informed consent to pursue genetic testing and the blood sample was sent to [facility] for analysis of the Breast Cancer STAT Panel + Common Hereditary Cancers Panel. Results should be available within approximately 1 weeks' time, at which point they will be disclosed by telephone to Cassandra Torres, as will any additional recommendations warranted by these results. Cassandra Torres will receive a summary of her genetic counseling visit and a copy of her results once available. This information will also be available in Epic.   Cassandra Torres questions were answered to her satisfaction today. Our contact information was provided should additional questions or concerns arise. Thank you for the referral and allowing [place] to share in the care of your patient.   Faith Rogue, MS, [facility] Genetic Counselor [email or web address] Phone: [phone]  The patient was seen for a total of 20 minutes in face-to-face genetic counseling.  Drs. Magrinat, Lindi Adie and/or Burr Medico were available for discussion regarding this case.   _______________________________________________________________________ For Office Staff:  Number of people involved in session: 1 Was an Intern/ student involved with case: no

## 2019-03-07 NOTE — Assessment & Plan Note (Signed)
03/02/2019:Screening mammogram showed right breast masses. Diagnostic mammogram and US showed a 1.2cm mass at the 7 o'clock position 7cm from the nipple, and a 1.1cm mass at the 7 o'clock position 6cm from the nipple, spanning 2.3cm in total, with no evidence of right axillary adenopathy. Biopsy showed IDC, grade 3, HER-2 equivocal by IHC, negative by FISH, ER+ 90%, PR+ 20%, Ki67 2%.   Pathology and radiology counseling:Discussed with the patient, the details of pathology including the type of breast cancer,the clinical staging, the significance of ER, PR and HER-2/neu receptors and the implications for treatment. After reviewing the pathology in detail, we proceeded to discuss the different treatment options between surgery, radiation, chemotherapy, antiestrogen therapies.  Recommendations: 1. Breast conserving surgery followed by 2. Oncotype DX testing to determine if chemotherapy would be of any benefit followed by 3. Adjuvant radiation therapy followed by 4. Adjuvant antiestrogen therapy 5.  Genetic testing  Oncotype counseling: I discussed Oncotype DX test. I explained to the patient that this is a 21 gene panel to evaluate patient tumors DNA to calculate recurrence score. This would help determine whether patient has high risk or intermediate risk or low risk breast cancer. She understands that if her tumor was found to be high risk, she would benefit from systemic chemotherapy. If low risk, no need of chemotherapy. If she was found to be intermediate risk, we would need to evaluate the score as well as other risk factors and determine if an abbreviated chemotherapy may be of benefit.  Return to clinic after surgery to discuss final pathology report and then determine if Oncotype DX testing will need to be sent.

## 2019-03-07 NOTE — Progress Notes (Signed)
[place]  RADIATION ONCOLOGY CONSULT NOTE   CHIEF COMPLAINTS/PURPOSE OF CONSULTATION:  Newly diagnosed breast cancer  HISTORY OF PRESENTING ILLNESS:  Cassandra Torres 54 y.o. female is here because of recent diagnosis of invasive ductal carcinoma of the right breast. Screening mammogram on 02/01/19 showed right breast masses. Diagnostic mammogram and [place] on 02/14/19 showed a 1.2cm mass at the 7 o'clock position in the right breast 7cm from the nipple, and a 1.1cm mass at the 7 o'clock position 6cm from the nipple, spanning 2.3cm in total, with no evidence of right axillary adenopathy. Biopsy on 02/27/19 showed invasive ductal carcinoma, grade 3, HER-2 equivocal by IHC, negative by FISH, ER+ 90%, PR+ 20%, Ki67 2%.  Second biopsy 6 cm from the nipple showed no evidence of malignancy. she presents to the clinic today for initial evaluation and discussion of treatment options.  She is accompanied by her husband today.  I reviewed her records extensively and collaborated the history with the patient.  SUMMARY OF ONCOLOGIC HISTORY: Oncology History  Malignant neoplasm of lower-outer quadrant of right breast of female, estrogen receptor positive ([place])  03/02/2019 Initial Diagnosis   Screening mammogram showed right breast masses. Diagnostic mammogram and US showed a 1.2cm mass at the 7 o'clock position 7cm from the nipple, and a 1.1cm mass at the 7 o'clock position 6cm from the nipple, spanning 2.3cm in total, with no evidence of right axillary adenopathy. Biopsy showed IDC, grade 3, HER-2 equivocal by IHC, negative by FISH, ER+ 90%, PR+ 20%, Ki67 2%.      MEDICAL HISTORY:  Past Medical History:  Diagnosis Date  . Family history of brain cancer   . Family history of breast cancer   . Family history of prostate cancer   . Seasonal allergies   . Thyrotoxicosis     SURGICAL HISTORY: No past surgical history on file.  SOCIAL HISTORY: Social History   Socioeconomic History  .  Marital status: Married    Spouse name: Not on file  . Number of children: Not on file  . Years of education: Not on file  . Highest education level: Not on file  Occupational History  . Not on file  Tobacco Use  . Smoking status: Never Smoker  . Smokeless tobacco: Never Used  Substance and Sexual Activity  . Alcohol use: Yes    Comment: occasional  . Drug use: Never  . Sexual activity: Not on file  Other Topics Concern  . Not on file  Social History Narrative  . Not on file   Social Determinants of Health   Financial Resource Strain:   . Difficulty of Paying Living Expenses: Not on file  Food Insecurity:   . Worried About Charity fundraiser in the Last Year: Not on file  . Ran Out of Food in the Last Year: Not on file  Transportation Needs:   . Lack of Transportation (Medical): Not on file  . Lack of Transportation (Non-Medical): Not on file  Physical Activity:   . Days of Exercise per Week: Not on file  . Minutes of Exercise per Session: Not on file  Stress:   . Feeling of Stress : Not on file  Social Connections:   . Frequency of Communication with Friends and Family: Not on file  . Frequency of Social Gatherings with Friends and Family: Not on file  . Attends Religious Services: Not on file  . Active Member of Clubs or Organizations: Not on file  . Attends Club  or Organization Meetings: Not on file  . Marital Status: Not on file  Intimate Partner Violence:   . Fear of Current or Ex-Partner: Not on file  . Emotionally Abused: Not on file  . Physically Abused: Not on file  . Sexually Abused: Not on file    FAMILY HISTORY: Family History  Problem Relation Age of Onset  . Hypertension Mother   . Healthy Father   . Prostate cancer Maternal Grandfather        dx 54s  . Breast cancer Paternal Aunt        dx 80s  . Brain cancer Maternal Grandmother        d. 93    ALLERGIES:  is allergic to levaquin [levofloxacin]; penicillins; and sulfur.  MEDICATIONS:    Current Outpatient Medications  Medication Sig Dispense Refill  . diphenhydrAMINE (BENADRYL) 25 mg capsule Take 25 mg by mouth as needed.    Marland Kitchen ELDERBERRY PO Take 50 mg by mouth daily.    . hydroxychloroquine (PLAQUENIL) 200 MG tablet TAKE 1 TABLET BY MOUTH DAILY DX: L56.8 90 tablet 0  . loratadine (CLARITIN) 10 MG tablet Take by mouth.    . Multiple Vitamin (MULTIVITAMINS PO) Take by mouth.     No current facility-administered medications for this encounter.    REVIEW OF SYSTEMS:   Constitutional: Denies fevers, chills or abnormal night sweats Eyes: Denies blurriness of vision, double vision or watery eyes Ears, nose, mouth, throat, and face: Denies mucositis or sore throat Respiratory: Denies cough, dyspnea or wheezes Cardiovascular: Denies palpitation, chest discomfort or lower extremity swelling Gastrointestinal:  Denies nausea, heartburn or change in bowel habits Skin: Denies abnormal skin rashes Lymphatics: Denies new lymphadenopathy or easy bruising Neurological:Denies numbness, tingling or new weaknesses Behavioral/Psych: Mood is stable, no new changes  Breast: Denies any palpable lumps or discharge All other systems were reviewed with the patient and are negative.  PHYSICAL EXAMINATION: ECOG PERFORMANCE STATUS: 1 - Symptomatic but completely ambulatory  Vitals - 1 value per visit 03/26/1759  SYSTOLIC 607  DIASTOLIC 88  Pulse 88  Temperature 98.3  Respirations 20  Weight (lb) 198.1  Height 5' 7.4"  BMI 30.66  VISIT REPORT   GENERAL:alert, no distress and comfortable SKIN: skin color, texture, turgor are normal, no rashes or significant lesions EYES: normal, conjunctiva are pink and non-injected, sclera clear NECK: supple, thyroid normal size, non-tender, without nodularity LYMPH:  no palpable lymphadenopathy in the cervical, axillary or inguinal LUNGS: clear to auscultation and percussion with normal breathing effort HEART: regular rate & rhythm and no murmurs and  no lower extremity edema ABDOMEN:abdomen soft, non-tender and normal bowel sounds Musculoskeletal:no cyanosis of digits and no clubbing  PSYCH: alert & oriented x 3 with fluent speech NEURO: no focal motor/sensory deficits BREAST: Left: No palpable mass nipple discharge or bleeding Right breast: Mild bruising in the lower outer aspect Steri-Strips in place. No dominant  Mass appreciated in the breast nipple discharge or bleeding  LABORATORY DATA:  I have reviewed the data as listed Lab Results  Component Value Date   WBC 8.3 03/07/2019   HGB 13.2 03/07/2019   HCT 40.8 03/07/2019   MCV 88.5 03/07/2019   PLT 227 03/07/2019   Lab Results  Component Value Date   NA 141 03/07/2019   K 4.0 03/07/2019   CL 105 03/07/2019   CO2 25 03/07/2019    RADIOGRAPHIC STUDIES: I have personally reviewed the radiological reports and agreed with the findings in the report.  ASSESSMENT : 54 year old female with a new diagnosis of clinical stage I invasive ductal carcinoma of the right breast.  The patient will be a good candidate for breast conservation with lumpectomy sentinel node procedure and adjuvant radiation therapy patient notes wish to proceed with breast conserving surgery.   Today I discussed the general course of radiation therapy expected side effects and potential long-term toxicities of this therapy.  Patient appears to understand and wishes to proceed with this treatment approach.  PLAN: 1. Genetics evaluation 2.  Breast conserving surgery with sentinel node procedure and lumpectomy 3.  Oncotype to determine the potential benefit of adjuvant chemotherapy 4.  Adjuvant radiation therapy 5.  Adjuvant hormonal therapy  -----------------------------------  Blair Promise, PhD, MD

## 2019-03-08 ENCOUNTER — Encounter: Payer: Self-pay | Admitting: Licensed Clinical Social Worker

## 2019-03-08 NOTE — Progress Notes (Signed)
Clinical Social Work [place] Psychosocial Distress Screening [place]   Patient completed distress screening protocol and scored a 5 on the Psychosocial Distress Thermometer which indicates moderate distress. Clinical Education officer, museum met with patient by phone after [facility] (pt had to leave before seeing CSW in clinic) to assess for distress and other psychosocial needs.  Patient stated she was feeling overwhelmed but felt better after meeting with the treatment team and getting more information on her treatment plan.  She feels she has a better understanding now of what days she will need to be out from work.  CSW and patient discussed common feeling and emotions when being diagnosed with cancer, and the importance of support during treatment. Patient has good support from husband, sister, mom, and friends. 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  CSW informed patient of the support team and support services at [facility].  CSW provided contact information and encouraged patient to call with any questions or concerns.    Distress Screen: ONCBCN DISTRESS SCREENING 03/08/2019  Screening Type Initial Screening  Distress experienced in past week (1-10) 5  Practical problem type Work/school  Emotional problem type Nervousness/Anxiety;Adjusting to illness  Information Concerns Type Lack of info about diagnosis;Lack of info about treatment     [place]

## 2019-03-12 ENCOUNTER — Other Ambulatory Visit: Payer: Self-pay | Admitting: General Surgery

## 2019-03-12 ENCOUNTER — Telehealth: Payer: Self-pay | Admitting: Hematology and Oncology

## 2019-03-12 DIAGNOSIS — Z170 Estrogen receptor positive status [ER+]: Secondary | ICD-10-CM

## 2019-03-12 DIAGNOSIS — C50511 Malignant neoplasm of lower-outer quadrant of right female breast: Secondary | ICD-10-CM

## 2019-03-12 NOTE — Telephone Encounter (Signed)
Called pt per 3/1 sch message - unable to reach pt - left message with appt date and time

## 2019-03-14 ENCOUNTER — Telehealth: Payer: Self-pay

## 2019-03-14 ENCOUNTER — Encounter (HOSPITAL_BASED_OUTPATIENT_CLINIC_OR_DEPARTMENT_OTHER): Payer: Self-pay | Admitting: General Surgery

## 2019-03-14 ENCOUNTER — Other Ambulatory Visit: Payer: Self-pay

## 2019-03-14 NOTE — Telephone Encounter (Signed)
Nutrition  Patient identified as attended Breast Clinic on 03/07/19.    Chart reviewed.   Called patient to introduce self and service at [facility].  No answer.  Left message on voicemail with call back number  Cassandra Torres B. Zenia Resides, [place], [place] Registered Dietitian [phone] (pager)

## 2019-03-15 ENCOUNTER — Telehealth: Payer: Self-pay | Admitting: *Deleted

## 2019-03-15 NOTE — Telephone Encounter (Signed)
Spoke with patient to follow up from [facility] and assess navigation needs.  Patient is doing well and is aware of her appointments.  Encouraged her to call with any needs or concerns.  Patient verbalized understanding.

## 2019-03-16 MED ORDER — ENSURE PRE-SURGERY PO LIQD: 296.0000 mL | Freq: Once | ORAL | Status: DC

## 2019-03-16 NOTE — Progress Notes (Signed)

## 2019-03-17 ENCOUNTER — Other Ambulatory Visit (HOSPITAL_COMMUNITY)
Admission: RE | Admit: 2019-03-17 | Discharge: 2019-03-17 | Disposition: A | Discharge status: Home / Self Care | Payer: 59 | Source: Ambulatory Visit | Attending: General Surgery | Admitting: General Surgery

## 2019-03-17 DIAGNOSIS — Z01812 Encounter for preprocedural laboratory examination: Secondary | ICD-10-CM | POA: Diagnosis present

## 2019-03-17 DIAGNOSIS — Z20822 Contact with and (suspected) exposure to covid-19: Secondary | ICD-10-CM | POA: Diagnosis not present

## 2019-03-17 LAB — SARS CORONAVIRUS 2 (TAT 6-24 HRS): SARS Coronavirus 2: NEGATIVE

## 2019-03-19 ENCOUNTER — Telehealth: Payer: Self-pay | Admitting: Licensed Clinical Social Worker

## 2019-03-19 ENCOUNTER — Ambulatory Visit: Payer: Self-pay | Admitting: Licensed Clinical Social Worker

## 2019-03-19 ENCOUNTER — Encounter: Payer: Self-pay | Admitting: Licensed Clinical Social Worker

## 2019-03-19 DIAGNOSIS — C50511 Malignant neoplasm of lower-outer quadrant of right female breast: Secondary | ICD-10-CM

## 2019-03-19 DIAGNOSIS — Z1379 Encounter for other screening for genetic and chromosomal anomalies: Secondary | ICD-10-CM | POA: Insufficient documentation

## 2019-03-19 DIAGNOSIS — Z808 Family history of malignant neoplasm of other organs or systems: Secondary | ICD-10-CM

## 2019-03-19 DIAGNOSIS — Z803 Family history of malignant neoplasm of breast: Secondary | ICD-10-CM

## 2019-03-19 DIAGNOSIS — Z170 Estrogen receptor positive status [ER+]: Secondary | ICD-10-CM

## 2019-03-19 DIAGNOSIS — Z8042 Family history of malignant neoplasm of prostate: Secondary | ICD-10-CM

## 2019-03-19 NOTE — Telephone Encounter (Signed)
Revealed negative genetic testing.  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  We discussed that we do not know why she has breast cancer or why there is cancer in the family. It could be due to a different gene that we are not testing, or something our current technology cannot pick up.  It will be important for her to keep in contact with genetics to learn if additional testing may be needed in the future.

## 2019-03-19 NOTE — Progress Notes (Signed)
HPI:  Ms. Andujar was previously seen in the [facility] clinic due to a personal and family history of cancer and concerns regarding a hereditary predisposition to cancer. Please refer to our prior cancer genetics clinic note for more information regarding our discussion, assessment and recommendations, at the time. Ms. Vorce recent genetic test results were disclosed to her, as were recommendations warranted by these results. These results and recommendations are discussed in more detail below.  CANCER HISTORY:  Oncology History  Malignant neoplasm of lower-outer quadrant of right breast of female, estrogen receptor positive ([place])  03/02/2019 Initial Diagnosis   Screening mammogram showed right breast masses. Diagnostic mammogram and US showed a 1.2cm mass at the 7 o'clock position 7cm from the nipple, and a 1.1cm mass at the 7 o'clock position 6cm from the nipple, spanning 2.3cm in total, with no evidence of right axillary adenopathy. Biopsy showed IDC, grade 3, HER-2 equivocal by IHC, negative by FISH, ER+ 90%, PR+ 20%, Ki67 2%.     Genetic Testing   Negative genetic testing. No pathogenic variants identified on the [facility] Breast Cancer STAT Panel + Common Hereditary Cancers Panel + Brain Cancer Panel. VUS in KIT called [id number] identified. The report date is 03/17/2019.  The Breast Cancer STAT Panel + Common Hereditary Cancers Panel + Brain Cancer Panel offered by [facility] includes sequencing and/or deletion duplication testing of the following 70 genes: AIP, ALK, APC*, ATM*, AXIN2, BAP1, BARD1, BMPR1A, BRCA1, BRCA2, BRIP1, CDH1, CDK4, CDKN2A (p14ARF), CDKN2A (p16INK4a), CHEK2, CTNNA1, DICER1*, EPCAM*, EZH2*, GPC3*, GREM1*, HOXB13, HRAS, KIF1B*, KIT, LZTR1, MAX*, MEN1*, MLH1*, MSH2*, MSH3*, MSH6*, MUTYH, NBN, NF1*, NF2, NTHL1, PALB2, PDGFRA, PHOX2B*, PMS2*, POLD1*, POLE, POT1, PRKAR1A, PTCH1, PTCH2, PTEN*, RAD50, RAD51C, RAD51D, RB1*, RET, RNF43, SDHA*, SDHAF2, SDHB, SDHC*, SDHD, SMAD4,  SMARCA4, SMARCB1, SMARCE1, STK11, SUFU, TMEM127, TP53, TSC1*, TSC2, VHL.     FAMILY HISTORY:  We obtained a detailed, 4-generation family history.  Significant diagnoses are listed below: Family History  Problem Relation Age of Onset  . Hypertension Mother   . Healthy Father   . Prostate cancer Maternal Grandfather        dx 47s  . Breast cancer Paternal Aunt        dx 75s  . Brain cancer Maternal Grandmother        d. 28   Ms. Strickland has one son, 36, no history of cancer. She has 3 maternal half sisters, all living with no history of cancer. She has 2 paternal half sisters, she does not have good health history for them.   Ms. Tomasini mother is living at 60 with no history of cancer. Patient had 3 maternal aunts and 2 maternal uncles, no cancers. No known cancers in maternal cousins. Maternal grandmother had brain cancer and died at 2. Maternal grandfather had prostate cancer and died at 89. This grandfather had a sister who had colon cancer in her 89s.  Ms. Larranaga father is living at 27, no cancer history. Patient has 2 paternal aunts, 3 paternal uncles. One of her paternal aunts had breast cancer in her 95s and was diagnosed with breast cancer for a second time as well. No genetic testing. She is living in her 29s. No known cancers in paternal cousins. Paternal grandparents both passed in their 37s.   Ms. Ballo is unaware of previous family history of genetic testing for hereditary cancer risks. Patient's ancestors are of [place] American and Native American descent. There is no reported Ashkenazi Jewish ancestry. There is  no known consanguinity.  GENETIC TEST RESULTS: Genetic testing reported out on 03/17/2019 through the [facility] Breast Cancer STAT Panel + Common Hereditary Cancers Panel + Brain cancer panel found no pathogenic mutations.  The Breast Cancer STAT Panel + Common Hereditary Cancers Panel + Brain Cancer Panel offered by [facility] includes sequencing and/or deletion  duplication testing of the following 70 genes: AIP, ALK, APC*, ATM*, AXIN2, BAP1, BARD1, BMPR1A, BRCA1, BRCA2, BRIP1, CDH1, CDK4, CDKN2A (p14ARF), CDKN2A (p16INK4a), CHEK2, CTNNA1, DICER1*, EPCAM*, EZH2*, GPC3*, GREM1*, HOXB13, HRAS, KIF1B*, KIT, LZTR1, MAX*, MEN1*, MLH1*, MSH2*, MSH3*, MSH6*, MUTYH, NBN, NF1*, NF2, NTHL1, PALB2, PDGFRA, PHOX2B*, PMS2*, POLD1*, POLE, POT1, PRKAR1A, PTCH1, PTCH2, PTEN*, RAD50, RAD51C, RAD51D, RB1*, RET, RNF43, SDHA*, SDHAF2, SDHB, SDHC*, SDHD, SMAD4, SMARCA4, SMARCB1, SMARCE1, STK11, SUFU, TMEM127, TP53, TSC1*, TSC2, VHL.  The test report has been scanned into EPIC and is located under the Molecular Pathology section of the Results Review tab.  A portion of the result report is included below for reference.     We discussed with Ms. Court that because current genetic testing is not perfect, it is possible there may be a gene mutation in one of these genes that current testing cannot detect, but that chance is small.  We also discussed, that there could be another gene that has not yet been discovered, or that we have not yet tested, that is responsible for the cancer diagnoses in the family. It is also possible there is a hereditary cause for the cancer in the family that Ms. Godley did not inherit and therefore was not identified in her testing.  Therefore, it is important to remain in touch with cancer genetics in the future so that we can continue to offer Ms. Swartz the most up to date genetic testing.   Genetic testing did identify a variant of uncertain significance (VUS) was identified in the KIT gene called c.[id number].  At this time, it is unknown if this variant is associated with increased cancer risk or if this is a normal finding, but most variants such as this get reclassified to being inconsequential. It should not be used to make medical management decisions. With time, we suspect the lab will determine the significance of this variant, if any. If we do learn more  about it, we will try to contact Ms. Achille to discuss it further. However, it is important to stay in touch with [place] periodically and keep the address and phone number up to date.  ADDITIONAL GENETIC TESTING: We discussed with Ms. Gartland that her genetic testing was fairly extensive.  If there are genes identified to increase cancer risk that can be analyzed in the future, we would be happy to discuss and coordinate this testing at that time.    CANCER SCREENING RECOMMENDATIONS: Ms. Mantia test result is considered negative (normal).  This means that we have not identified a hereditary cause for her  personal and family history of cancer at this time. Most cancers happen by chance and this negative test suggests that her cancer may fall into this category.    While reassuring, this does not definitively rule out a hereditary predisposition to cancer. It is still possible that there could be genetic mutations that are undetectable by current technology. There could be genetic mutations in genes that have not been tested or identified to increase cancer risk.  Therefore, it is recommended she continue to follow the cancer management and screening guidelines provided by her oncology and primary healthcare provider.   An  individual's cancer risk and medical management are not determined by genetic test results alone. Overall cancer risk assessment incorporates additional factors, including personal medical history, family history, and any available genetic information that may result in a personalized plan for cancer prevention and surveillance.  RECOMMENDATIONS FOR FAMILY MEMBERS:  Relatives in this family might be at some increased risk of developing cancer, over the general population risk, simply due to the family history of cancer.  We recommended female relatives in this family have a yearly mammogram beginning at age 65, or 57 years younger than the earliest onset of cancer, an annual clinical breast  exam, and perform monthly breast self-exams. Female relatives in this family should also have a gynecological exam as recommended by their primary provider. All family members should have a colonoscopy by age 56, or as directed by their physicians.  It is also possible there is a hereditary cause for the cancer in Ms. Ramnath's family that she did not inherit and therefore was not identified in her.  Based on Ms. Furth's family history, we recommended paternal aunt/relatives have genetic counseling and testing. Ms. Shoaf will let us know if we can be of any assistance in coordinating genetic counseling and/or testing for these family members.  FOLLOW-UP: Lastly, we discussed with Ms. Costanzo that cancer genetics is a rapidly advancing field and it is possible that new genetic tests will be appropriate for her and/or her family members in the future. We encouraged her to remain in contact with cancer genetics on an annual basis so we can update her personal and family histories and let her know of advances in cancer genetics that may benefit this family.   Our contact number was provided. Ms. Lombardozzi questions were answered to her satisfaction, and she knows she is welcome to call us at anytime with additional questions or concerns.   Faith Rogue, MS, [facility] Genetic Counselor [email or web address] Phone: [phone]

## 2019-03-20 ENCOUNTER — Other Ambulatory Visit: Payer: Self-pay

## 2019-03-20 ENCOUNTER — Ambulatory Visit
Admission: RE | Admit: 2019-03-20 | Discharge: 2019-03-20 | Disposition: A | Discharge status: Home / Self Care | Payer: 59 | Source: Ambulatory Visit | Attending: General Surgery | Admitting: General Surgery

## 2019-03-20 DIAGNOSIS — C50511 Malignant neoplasm of lower-outer quadrant of right female breast: Secondary | ICD-10-CM

## 2019-03-20 DIAGNOSIS — Z170 Estrogen receptor positive status [ER+]: Secondary | ICD-10-CM

## 2019-03-21 ENCOUNTER — Encounter (HOSPITAL_BASED_OUTPATIENT_CLINIC_OR_DEPARTMENT_OTHER)
Admission: RE | Disposition: A | Discharge status: Home / Self Care | Payer: Self-pay | Source: Home / Self Care | Attending: General Surgery

## 2019-03-21 ENCOUNTER — Ambulatory Visit (HOSPITAL_BASED_OUTPATIENT_CLINIC_OR_DEPARTMENT_OTHER)
Admission: RE | Admit: 2019-03-21 | Discharge: 2019-03-21 | Disposition: A | Discharge status: Home / Self Care | Payer: 59 | Attending: General Surgery | Admitting: General Surgery

## 2019-03-21 ENCOUNTER — Ambulatory Visit
Admission: RE | Admit: 2019-03-21 | Discharge: 2019-03-21 | Disposition: A | Discharge status: Home / Self Care | Payer: Managed Care, Other (non HMO) | Source: Ambulatory Visit | Attending: General Surgery | Admitting: General Surgery

## 2019-03-21 ENCOUNTER — Ambulatory Visit (HOSPITAL_BASED_OUTPATIENT_CLINIC_OR_DEPARTMENT_OTHER): Payer: 59 | Admitting: Anesthesiology

## 2019-03-21 ENCOUNTER — Other Ambulatory Visit: Payer: Self-pay

## 2019-03-21 ENCOUNTER — Encounter (HOSPITAL_COMMUNITY)
Admission: RE | Admit: 2019-03-21 | Discharge: 2019-03-21 | Disposition: A | Discharge status: Home / Self Care | Payer: 59 | Source: Ambulatory Visit | Attending: General Surgery | Admitting: General Surgery

## 2019-03-21 ENCOUNTER — Encounter (HOSPITAL_BASED_OUTPATIENT_CLINIC_OR_DEPARTMENT_OTHER): Payer: Self-pay | Admitting: General Surgery

## 2019-03-21 DIAGNOSIS — Z803 Family history of malignant neoplasm of breast: Secondary | ICD-10-CM | POA: Insufficient documentation

## 2019-03-21 DIAGNOSIS — C50511 Malignant neoplasm of lower-outer quadrant of right female breast: Secondary | ICD-10-CM

## 2019-03-21 DIAGNOSIS — Z6830 Body mass index (BMI) 30.0-30.9, adult: Secondary | ICD-10-CM | POA: Insufficient documentation

## 2019-03-21 DIAGNOSIS — E669 Obesity, unspecified: Secondary | ICD-10-CM | POA: Insufficient documentation

## 2019-03-21 DIAGNOSIS — C50411 Malignant neoplasm of upper-outer quadrant of right female breast: Secondary | ICD-10-CM | POA: Diagnosis present

## 2019-03-21 DIAGNOSIS — N6489 Other specified disorders of breast: Secondary | ICD-10-CM | POA: Insufficient documentation

## 2019-03-21 DIAGNOSIS — Z170 Estrogen receptor positive status [ER+]: Secondary | ICD-10-CM

## 2019-03-21 HISTORY — PX: BREAST LUMPECTOMY WITH RADIOACTIVE SEED AND SENTINEL LYMPH NODE BIOPSY: SHX6550

## 2019-03-21 LAB — POCT PREGNANCY, URINE: Preg Test, Ur: NEGATIVE

## 2019-03-21 SURGERY — BREAST LUMPECTOMY WITH RADIOACTIVE SEED AND SENTINEL LYMPH NODE BIOPSY
Anesthesia: General | Site: Breast | Laterality: Right

## 2019-03-21 MED ORDER — CLINDAMYCIN PHOSPHATE 900 MG/50ML IV SOLN
INTRAVENOUS | Status: AC
  Filled 2019-03-21: qty 50

## 2019-03-21 MED ORDER — PROPOFOL 10 MG/ML IV BOLUS
INTRAVENOUS | Status: AC
  Filled 2019-03-21: qty 20

## 2019-03-21 MED ORDER — FENTANYL CITRATE (PF) 100 MCG/2ML IJ SOLN
INTRAMUSCULAR | Status: AC
  Filled 2019-03-21: qty 2

## 2019-03-21 MED ORDER — SCOPOLAMINE 1 MG/3DAYS TD PT72: 1.0000 | MEDICATED_PATCH | TRANSDERMAL | Status: DC

## 2019-03-21 MED ORDER — LIDOCAINE 2% (20 MG/ML) 5 ML SYRINGE
INTRAMUSCULAR | Status: AC
  Filled 2019-03-21: qty 5

## 2019-03-21 MED ORDER — OXYCODONE HCL 5 MG/5ML PO SOLN: 5.0000 mg | Freq: Once | ORAL | Status: DC | PRN

## 2019-03-21 MED ORDER — PROMETHAZINE HCL 25 MG/ML IJ SOLN
6.2500 mg | Freq: Once | INTRAMUSCULAR | Status: AC
  Administered 2019-03-21: 6.25 mg via INTRAVENOUS

## 2019-03-21 MED ORDER — ONDANSETRON HCL 4 MG/2ML IJ SOLN
INTRAMUSCULAR | Status: DC | PRN
  Administered 2019-03-21: 4 mg via INTRAVENOUS

## 2019-03-21 MED ORDER — ACETAMINOPHEN 500 MG PO TABS
1000.0000 mg | ORAL_TABLET | ORAL | Status: AC
  Administered 2019-03-21: 1000 mg via ORAL

## 2019-03-21 MED ORDER — PROPOFOL 10 MG/ML IV BOLUS
INTRAVENOUS | Status: DC | PRN
  Administered 2019-03-21: 200 mg via INTRAVENOUS

## 2019-03-21 MED ORDER — PROMETHAZINE HCL 25 MG/ML IJ SOLN
INTRAMUSCULAR | Status: AC
  Filled 2019-03-21: qty 1

## 2019-03-21 MED ORDER — GABAPENTIN 100 MG PO CAPS
ORAL_CAPSULE | ORAL | Status: AC
  Filled 2019-03-21: qty 1

## 2019-03-21 MED ORDER — BUPIVACAINE HCL 0.5 % IJ SOLN
INTRAMUSCULAR | Status: DC | PRN
  Administered 2019-03-21: 20 mL

## 2019-03-21 MED ORDER — LACTATED RINGERS IV SOLN: INTRAVENOUS | Status: DC

## 2019-03-21 MED ORDER — BUPIVACAINE LIPOSOME 1.3 % IJ SUSP
INTRAMUSCULAR | Status: DC | PRN
  Administered 2019-03-21: 10 mL via PERINEURAL

## 2019-03-21 MED ORDER — ONDANSETRON HCL 4 MG/2ML IJ SOLN: 4.0000 mg | Freq: Once | INTRAMUSCULAR | Status: DC | PRN

## 2019-03-21 MED ORDER — SODIUM CHLORIDE (PF) 0.9 % IJ SOLN
INTRAMUSCULAR | Status: AC
  Filled 2019-03-21: qty 10

## 2019-03-21 MED ORDER — BUPIVACAINE HCL (PF) 0.25 % IJ SOLN
INTRAMUSCULAR | Status: DC | PRN
  Administered 2019-03-21: 6 mL

## 2019-03-21 MED ORDER — MIDAZOLAM HCL 2 MG/2ML IJ SOLN
1.0000 mg | INTRAMUSCULAR | Status: DC | PRN
  Administered 2019-03-21: 2 mg via INTRAVENOUS

## 2019-03-21 MED ORDER — MEPERIDINE HCL 25 MG/ML IJ SOLN: 6.2500 mg | INTRAMUSCULAR | Status: DC | PRN

## 2019-03-21 MED ORDER — KETOROLAC TROMETHAMINE 15 MG/ML IJ SOLN
INTRAMUSCULAR | Status: AC
  Filled 2019-03-21: qty 1

## 2019-03-21 MED ORDER — ACETAMINOPHEN 500 MG PO TABS
ORAL_TABLET | ORAL | Status: AC
  Filled 2019-03-21: qty 2

## 2019-03-21 MED ORDER — FENTANYL CITRATE (PF) 100 MCG/2ML IJ SOLN
INTRAMUSCULAR | Status: DC | PRN
  Administered 2019-03-21 (×2): 50 ug via INTRAVENOUS

## 2019-03-21 MED ORDER — BUPIVACAINE HCL (PF) 0.25 % IJ SOLN
INTRAMUSCULAR | Status: AC
  Filled 2019-03-21: qty 30

## 2019-03-21 MED ORDER — FENTANYL CITRATE (PF) 100 MCG/2ML IJ SOLN
25.0000 ug | INTRAMUSCULAR | Status: DC | PRN
  Administered 2019-03-21 (×2): 50 ug via INTRAVENOUS

## 2019-03-21 MED ORDER — LIDOCAINE HCL (CARDIAC) PF 100 MG/5ML IV SOSY
PREFILLED_SYRINGE | INTRAVENOUS | Status: DC | PRN
  Administered 2019-03-21: 30 mg via INTRAVENOUS

## 2019-03-21 MED ORDER — MIDAZOLAM HCL 2 MG/2ML IJ SOLN
INTRAMUSCULAR | Status: AC
  Filled 2019-03-21: qty 2

## 2019-03-21 MED ORDER — PROPOFOL 500 MG/50ML IV EMUL
INTRAVENOUS | Status: DC | PRN
  Administered 2019-03-21: 25 ug/kg/min via INTRAVENOUS

## 2019-03-21 MED ORDER — DEXAMETHASONE SODIUM PHOSPHATE 10 MG/ML IJ SOLN
INTRAMUSCULAR | Status: AC
  Filled 2019-03-21: qty 1

## 2019-03-21 MED ORDER — METHYLENE BLUE 0.5 % INJ SOLN
INTRAVENOUS | Status: AC
  Filled 2019-03-21: qty 10

## 2019-03-21 MED ORDER — DEXAMETHASONE SODIUM PHOSPHATE 4 MG/ML IJ SOLN
INTRAMUSCULAR | Status: DC | PRN
  Administered 2019-03-21: 5 mg via INTRAVENOUS

## 2019-03-21 MED ORDER — SODIUM CHLORIDE (PF) 0.9 % IJ SOLN
INTRAVENOUS | Status: DC | PRN
  Administered 2019-03-21: 5 mL via SUBCUTANEOUS

## 2019-03-21 MED ORDER — ONDANSETRON HCL 4 MG/2ML IJ SOLN
INTRAMUSCULAR | Status: AC
  Filled 2019-03-21: qty 10

## 2019-03-21 MED ORDER — KETOROLAC TROMETHAMINE 15 MG/ML IJ SOLN
15.0000 mg | INTRAMUSCULAR | Status: AC
  Administered 2019-03-21: 15 mg via INTRAVENOUS

## 2019-03-21 MED ORDER — CLINDAMYCIN PHOSPHATE 900 MG/50ML IV SOLN
900.0000 mg | INTRAVENOUS | Status: AC
  Administered 2019-03-21: 900 mg via INTRAVENOUS

## 2019-03-21 MED ORDER — PROPOFOL 500 MG/50ML IV EMUL
INTRAVENOUS | Status: AC
  Filled 2019-03-21: qty 100

## 2019-03-21 MED ORDER — TECHNETIUM TC 99M SULFUR COLLOID FILTERED
1.0000 | Freq: Once | INTRAVENOUS | Status: AC | PRN
  Administered 2019-03-21: 1 via INTRADERMAL

## 2019-03-21 MED ORDER — OXYCODONE HCL 5 MG PO TABS: 5.0000 mg | ORAL_TABLET | Freq: Four times a day (QID) | ORAL | 0 refills | Status: DC | PRN

## 2019-03-21 MED ORDER — OXYCODONE HCL 5 MG PO TABS: 5.0000 mg | ORAL_TABLET | Freq: Once | ORAL | Status: DC | PRN

## 2019-03-21 MED ORDER — PHENYLEPHRINE HCL (PRESSORS) 10 MG/ML IV SOLN
INTRAVENOUS | Status: DC | PRN
  Administered 2019-03-21: 40 ug via INTRAVENOUS

## 2019-03-21 MED ORDER — PHENYLEPHRINE 40 MCG/ML (10ML) SYRINGE FOR IV PUSH (FOR BLOOD PRESSURE SUPPORT)
PREFILLED_SYRINGE | INTRAVENOUS | Status: AC
  Filled 2019-03-21: qty 20

## 2019-03-21 MED ORDER — GABAPENTIN 100 MG PO CAPS
100.0000 mg | ORAL_CAPSULE | ORAL | Status: AC
  Administered 2019-03-21: 100 mg via ORAL

## 2019-03-21 MED ORDER — FENTANYL CITRATE (PF) 100 MCG/2ML IJ SOLN
50.0000 ug | INTRAMUSCULAR | Status: DC | PRN
  Administered 2019-03-21: 100 ug via INTRAVENOUS

## 2019-03-21 SURGICAL SUPPLY — 62 items
APPLIER CLIP 9.375 MED OPEN (MISCELLANEOUS) ×3
BINDER BREAST LRG (GAUZE/BANDAGES/DRESSINGS) IMPLANT
BINDER BREAST MEDIUM (GAUZE/BANDAGES/DRESSINGS) IMPLANT
BINDER BREAST XLRG (GAUZE/BANDAGES/DRESSINGS) IMPLANT
BINDER BREAST XXLRG (GAUZE/BANDAGES/DRESSINGS) ×2 IMPLANT
BLADE SURG 15 STRL LF DISP TIS (BLADE) ×1 IMPLANT
BLADE SURG 15 STRL SS (BLADE) ×3
CANISTER SUC SOCK COL 7IN (MISCELLANEOUS) IMPLANT
CANISTER SUCT 1200ML W/VALVE (MISCELLANEOUS) IMPLANT
CHLORAPREP W/TINT 26 (MISCELLANEOUS) ×3 IMPLANT
CLIP APPLIE 9.375 MED OPEN (MISCELLANEOUS) IMPLANT
CLIP VESOCCLUDE SM WIDE 6/CT (CLIP) ×3 IMPLANT
CLOSURE WOUND 1/2 X4 (GAUZE/BANDAGES/DRESSINGS) ×1
COVER BACK TABLE 60X90IN (DRAPES) ×3 IMPLANT
COVER MAYO STAND STRL (DRAPES) ×3 IMPLANT
COVER PROBE W GEL 5X96 (DRAPES) ×3 IMPLANT
COVER WAND RF STERILE (DRAPES) IMPLANT
DECANTER SPIKE VIAL GLASS SM (MISCELLANEOUS) IMPLANT
DERMABOND ADVANCED (GAUZE/BANDAGES/DRESSINGS) ×2
DERMABOND ADVANCED .7 DNX12 (GAUZE/BANDAGES/DRESSINGS) ×1 IMPLANT
DRAPE LAPAROSCOPIC ABDOMINAL (DRAPES) ×3 IMPLANT
DRAPE UTILITY XL STRL (DRAPES) ×3 IMPLANT
ELECT COATED BLADE 2.86 ST (ELECTRODE) ×3 IMPLANT
ELECT REM PT RETURN 9FT ADLT (ELECTROSURGICAL) ×3
ELECTRODE REM PT RTRN 9FT ADLT (ELECTROSURGICAL) ×1 IMPLANT
GLOVE BIO SURGEON STRL SZ7 (GLOVE) ×8 IMPLANT
GLOVE BIOGEL PI IND STRL 7.0 (GLOVE) IMPLANT
GLOVE BIOGEL PI IND STRL 7.5 (GLOVE) ×1 IMPLANT
GLOVE BIOGEL PI INDICATOR 7.0 (GLOVE) ×2
GLOVE BIOGEL PI INDICATOR 7.5 (GLOVE) ×4
GOWN STRL REUS W/ TWL LRG LVL3 (GOWN DISPOSABLE) ×2 IMPLANT
GOWN STRL REUS W/ TWL XL LVL3 (GOWN DISPOSABLE) IMPLANT
GOWN STRL REUS W/TWL LRG LVL3 (GOWN DISPOSABLE) ×3
GOWN STRL REUS W/TWL XL LVL3 (GOWN DISPOSABLE) ×3
HEMOSTAT ARISTA ABSORB 3G PWDR (HEMOSTASIS) IMPLANT
KIT MARKER MARGIN INK (KITS) ×3 IMPLANT
NDL HYPO 25X1 1.5 SAFETY (NEEDLE) ×1 IMPLANT
NDL SAFETY ECLIPSE 18X1.5 (NEEDLE) IMPLANT
NEEDLE HYPO 18GX1.5 SHARP (NEEDLE)
NEEDLE HYPO 25X1 1.5 SAFETY (NEEDLE) ×3 IMPLANT
NS IRRIG 1000ML POUR BTL (IV SOLUTION) ×2 IMPLANT
PACK BASIN DAY SURGERY FS (CUSTOM PROCEDURE TRAY) ×3 IMPLANT
PENCIL SMOKE EVACUATOR (MISCELLANEOUS) ×3 IMPLANT
RETRACTOR ONETRAX LX 90X20 (MISCELLANEOUS) ×2 IMPLANT
SLEEVE SCD COMPRESS KNEE MED (MISCELLANEOUS) ×3 IMPLANT
SPONGE LAP 4X18 RFD (DISPOSABLE) ×5 IMPLANT
STRIP CLOSURE SKIN 1/2X4 (GAUZE/BANDAGES/DRESSINGS) ×2 IMPLANT
SUT ETHILON 2 0 FS 18 (SUTURE) IMPLANT
SUT MNCRL AB 4-0 PS2 18 (SUTURE) ×5 IMPLANT
SUT MON AB 5-0 PS2 18 (SUTURE) IMPLANT
SUT SILK 2 0 SH (SUTURE) ×2 IMPLANT
SUT VIC AB 2-0 SH 27 (SUTURE) ×12
SUT VIC AB 2-0 SH 27XBRD (SUTURE) ×1 IMPLANT
SUT VIC AB 3-0 SH 27 (SUTURE) ×6
SUT VIC AB 3-0 SH 27X BRD (SUTURE) ×1 IMPLANT
SUT VIC AB 5-0 PS2 18 (SUTURE) IMPLANT
SYR CONTROL 10ML LL (SYRINGE) ×3 IMPLANT
TOWEL GREEN STERILE FF (TOWEL DISPOSABLE) ×3 IMPLANT
TRAY FAXITRON CT DISP (TRAY / TRAY PROCEDURE) ×3 IMPLANT
TUBE CONNECTING 20'X1/4 (TUBING)
TUBE CONNECTING 20X1/4 (TUBING) IMPLANT
YANKAUER SUCT BULB TIP NO VENT (SUCTIONS) IMPLANT

## 2019-03-21 NOTE — Interval H&P Note (Signed)
History and Physical Interval Note:  03/21/2019 10:10 AM  Cassandra Torres  has presented today for surgery, with the diagnosis of RIGHT BREAST CANCER.  The various methods of treatment have been discussed with the patient and family. After consideration of risks, benefits and other options for treatment, the patient has consented to  Procedure(s): RIGHT BREAST LUMPECTOMY WITH BRACKETED RADIOACTIVE SEED AND SENTINEL LYMPH NODE BIOPSY (Right) as a surgical intervention.  The patient's history has been reviewed, patient examined, no change in status, stable for surgery.  I have reviewed the patient's chart and labs.  Questions were answered to the patient's satisfaction.     Rolm Bookbinder

## 2019-03-21 NOTE — Progress Notes (Signed)
Assisted Dr. Foster with right, ultrasound guided, pectoralis block. Side rails up, monitors on throughout procedure. See vital signs in flow sheet. Tolerated Procedure well. 

## 2019-03-21 NOTE — Anesthesia Procedure Notes (Signed)
Anesthesia Regional Block: Pectoralis block   Pre-Anesthetic Checklist: ,, timeout performed, Correct Patient, Correct Site, Correct Laterality, Correct Procedure, Correct Position, site marked, Risks and benefits discussed,  Surgical consent,  Pre-op evaluation,  At surgeon's request and post-op pain management  Laterality: Right  Prep: chloraprep       Needles:  Injection technique: Single-shot      Additional Needles:   Procedures:,,,, ultrasound used (permanent image in chart),,,,  Narrative:  Start time: 03/21/2019 9:13 AM End time: 03/21/2019 9:18 AM Injection made incrementally with aspirations every 5 mL.  Performed by: Personally  Anesthesiologist: Josephine Igo, MD  Additional Notes: Timeout performed. Patient sedated. Relevant anatomy ID'd using [place]. Incremental 2-50ml injection of LA with frequent aspiration. Patient tolerated procedure well.        Right Pectoralis Block

## 2019-03-21 NOTE — Transfer of Care (Signed)
Immediate Anesthesia Transfer of Care Note  Patient: Lizania Eickman  Procedure(s) Performed: RIGHT BREAST LUMPECTOMY WITH BRACKETED RADIOACTIVE SEED AND SENTINEL LYMPH NODE BIOPSY (Right Breast)  Patient Location: PACU  Anesthesia Type:GA combined with regional for post-op pain  Level of Consciousness: drowsy and patient cooperative  Airway & Oxygen Therapy: Patient Spontanous Breathing and Patient connected to face mask oxygen  Post-op Assessment: Report given to RN and Post -op Vital signs reviewed and stable  Post vital signs: Reviewed and stable  Last Vitals:  Vitals Value Taken Time  BP 114/81 03/21/19 1136  Temp    Pulse 64 03/21/19 1137  Resp    SpO2 98 % 03/21/19 1137  Vitals shown include unvalidated device data.  Last Pain:  Vitals:   03/21/19 0904  TempSrc: Oral  PainSc: 0-No pain         Complications: No apparent anesthesia complications

## 2019-03-21 NOTE — Discharge Instructions (Signed)
[place] Office Phone Number [phone]  BREAST BIOPSY/ PARTIAL MASTECTOMY: POST OP INSTRUCTIONS Take 400 mg of ibuprofen every 8 hours or 650 mg tylenol every 6 hours for next 72 hours then as needed. Use ice several times daily also. Always review your discharge instruction sheet given to you by the facility where your surgery was performed.  IF YOU HAVE DISABILITY OR FAMILY LEAVE FORMS, YOU MUST BRING THEM TO THE OFFICE FOR PROCESSING.  DO NOT GIVE THEM TO YOUR DOCTOR.  1. A prescription for pain medication may be given to you upon discharge.  Take your pain medication as prescribed, if needed.  If narcotic pain medicine is not needed, then you may take acetaminophen (Tylenol), naprosyn (Alleve) or ibuprofen (Advil) as needed. No Ibuprofen or Tylenol until 4pm if needed  2. Take your usually prescribed medications unless otherwise directed 3. If you need a refill on your pain medication, please contact your pharmacy.  They will contact our office to request authorization.  Prescriptions will not be filled after 5pm or on week-ends. 4. You should eat very light the first 24 hours after surgery, such as soup, crackers, pudding, etc.  Resume your normal diet the day after surgery. 5. Most patients will experience some swelling and bruising in the breast.  Ice packs and a good support bra will help.  Wear the breast binder provided or a sports bra for 72 hours day and night.  After that wear a sports bra during the day until you return to the office. Swelling and bruising can take several days to resolve.  6. It is common to experience some constipation if taking pain medication after surgery.  Increasing fluid intake and taking a stool softener will usually help or prevent this problem from occurring.  A mild laxative (Milk of Magnesia or Miralax) should be taken according to package directions if there are no bowel movements after 48 hours. 7. Unless discharge instructions  indicate otherwise, you may remove your bandages 48 hours after surgery and you may shower at that time.  You may have steri-strips (small skin tapes) in place directly over the incision.  These strips should be left on the skin for 7-10 days and will come off on their own.  If your surgeon used skin glue on the incision, you may shower in 24 hours.  The glue will flake off over the next 2-3 weeks.  Any sutures or staples will be removed at the office during your follow-up visit. 8. ACTIVITIES:  You may resume regular daily activities (gradually increasing) beginning the next day.  Wearing a good support bra or sports bra minimizes pain and swelling.  You may have sexual intercourse when it is comfortable. a. You may drive when you no longer are taking prescription pain medication, you can comfortably wear a seatbelt, and you can safely maneuver your car and apply brakes. b. RETURN TO WORK:  ______________________________________________________________________________________ 9. You should see your doctor in the office for a follow-up appointment approximately two weeks after your surgery.  Your doctors nurse will typically make your follow-up appointment when she calls you with your pathology report.  Expect your pathology report 3-4 business days after your surgery.  You may call to check if you do not hear from [place] after three days. 10. OTHER INSTRUCTIONS: _______________________________________________________________________________________________ _____________________________________________________________________________________________________________________________________ _____________________________________________________________________________________________________________________________________ _____________________________________________________________________________________________________________________________________  WHEN TO CALL DR WAKEFIELD: 1. Fever over 101.0 2. Nausea  and/or vomiting. 3. Extreme swelling or bruising. 4. Continued bleeding from incision. 5. Increased pain,  redness, or drainage from the incision.  The clinic staff is available to answer your questions during regular business hours.  Please dont hesitate to call and ask to speak to one of the nurses for clinical concerns.  If you have a medical emergency, go to the nearest emergency room or call 911.  A surgeon from [facility] is always on call at the hospital.  For further questions, please visit [email or web address]   Post Anesthesia Home Care Instructions  Activity: Get plenty of rest for the remainder of the day. A responsible individual must stay with you for 24 hours following the procedure.  For the next 24 hours, DO NOT: -Drive a car -Paediatric nurse -Drink alcoholic beverages -Take any medication unless instructed by your physician -Make any legal decisions or sign important papers.  Meals: Start with liquid foods such as gelatin or soup. Progress to regular foods as tolerated. Avoid greasy, spicy, heavy foods. If nausea and/or vomiting occur, drink only clear liquids until the nausea and/or vomiting subsides. Call your physician if vomiting continues.  Special Instructions/Symptoms: Your throat may feel dry or sore from the anesthesia or the breathing tube placed in your throat during surgery. If this causes discomfort, gargle with warm salt water. The discomfort should disappear within 24 hours.  If you had a scopolamine patch placed behind your ear for the management of post- operative nausea and/or vomiting:  1. The medication in the patch is effective for 72 hours, after which it should be removed.  Wrap patch in a tissue and discard in the trash. Wash hands thoroughly with soap and water. 2. You may remove the patch earlier than 72 hours if you experience unpleasant side effects which may include dry mouth, dizziness or visual disturbances. 3.  Avoid touching the patch. Wash your hands with soap and water after contact with the patch.     Information for Discharge Teaching: EXPAREL (bupivacaine liposome injectable suspension)   Your surgeon or anesthesiologist gave you EXPAREL(bupivacaine) to help control your pain after surgery.   EXPAREL is a local anesthetic that provides pain relief by numbing the tissue around the surgical site.  EXPAREL is designed to release pain medication over time and can control pain for up to 72 hours.  Depending on how you respond to EXPAREL, you may require less pain medication during your recovery.  Possible side effects:  Temporary loss of sensation or ability to move in the area where bupivacaine was injected.  Nausea, vomiting, constipation  Rarely, numbness and tingling in your mouth or lips, lightheadedness, or anxiety may occur.  Call your doctor right away if you think you may be experiencing any of these sensations, or if you have other questions regarding possible side effects.  Follow all other discharge instructions given to you by your surgeon or nurse. Eat a healthy diet and drink plenty of water or other fluids.  If you return to the hospital for any reason within 96 hours following the administration of EXPAREL, it is important for health care providers to know that you have received this anesthetic. A teal colored band has been placed on your arm with the date, time and amount of EXPAREL you have received in order to alert and inform your health care providers. Please leave this armband in place for the full 96 hours following administration, and then you may remove the band.

## 2019-03-21 NOTE — Anesthesia Procedure Notes (Signed)
Procedure Name: LMA Insertion Date/Time: 03/21/2019 10:19 AM Performed by: Signe Colt, CRNA Pre-anesthesia Checklist: Patient identified, Emergency Drugs available, Suction available and Patient being monitored Patient Re-evaluated:Patient Re-evaluated prior to induction Oxygen Delivery Method: Circle system utilized Preoxygenation: Pre-oxygenation with 100% oxygen Induction Type: IV induction Ventilation: Mask ventilation without difficulty LMA: LMA inserted LMA Size: 4.0 Number of attempts: 1 Airway Equipment and Method: Bite block Placement Confirmation: positive ETCO2 Tube secured with: Tape Dental Injury: Teeth and Oropharynx as per pre-operative assessment

## 2019-03-21 NOTE — Op Note (Signed)
Preoperative diagnosis: clinical stage I right breast cancer Postoperative diagnosis: saa Procedure: 1. Right breast seed guided lumpectomy (bracketed) 2. Deep right axillary sentinel node biopsy 3. Injection of blue dye for sentinel node identification Surgeon: Dr Serita Grammes Anesthesia: general with pec block EBL: 25 cc Specimens: 1. Right breast lumpectomy containing both seeds and clips 2. Deep right axillary nodes that were blue and highest count was 109 3. Additional right breast lateral and superior margins with markings of short superior, long lateral and double deep Complications none Drains none Sponge and needle count correct dispo recovery stable  Indications: 43 yof who had screening mm that shows right breast mass and distortion. she has no prior history. she has fh in paternal aunt with breast cancer. she has no mass or dc. MM showed 2 masses outer posterior breast. the first measures 1.2x1x1 cm by [place] and the second measures 1.1x0.7x0.5 cm in size. the two masses span 2.3 cm by ultasound. the clips are 1.2 cm apart. the axillary [place] is negative. 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 Elected to proceed with seed bracketed lumpectomy and sn biopsy.  Procedure: She had 2 seeds placed prior to beginning.  After informed consent was obtained she first underwent a pectoral block.  Antibiotics were given.  SCDs were in place.  She underwent  injection of technetium in the standard periareolar fashion.  She was then placed under general anesthesia.  She was prepped and draped in the standard sterile surgical fashion.  A surgical timeout was then performed.  The activity of technetium in the axilla was fairly faint.  I elected to also inject blue dye.  I used a mixture of methylene blue dye and saline injected in the periareolar and subareolar position and then massaged this.  Then  located both seeds in the lower outer quadrant.  These were fairly inferior.  I made a inframammary incision in order to hide the scar later.  I then used the lighted retractor to tunnel to the 2 seeds.  The 2 seeds in the surrounding tissue with an attempt to get a clear margin were then removed.  I then did a initial mammogram which showed the presence of both clips and both seeds in the specimen.  The 3D images showed that it looks like I was close at the lateral and superior margin so I remove more of these.  The posterior margin is the muscle.  I then obtained hemostasis.  I lifted up the breast tissue off the pectoralis muscle to close it.  I then placed clips in the cavity.  I closed the breast tissue with 2-0 Vicryl.  This was done in several layers.  I then closed the dermis with 3-0 Vicryl and the skin with 4-0 Monocryl.  Glue and Steri-Strips were later applied.  I located some faint radioactivity in the low axilla.  I made an incision below the axillary hairline.  I carried this to the axillary fascia.  I noted 2 or 3 radioactive nodes that were also blue.  These were excised.  I clipped all the visible lymphatics.  There was no background radioactivity and no additional blue dye.  Hemostasis was observed.  I closed this with 2-0 Vicryl, 3-0 Vicryl, and 4-0 Monocryl.  Glue and Steri-Strips were applied.  She tolerated this well.  A binder was placed.  She was extubated and transferred to recovery  in stable condition.

## 2019-03-21 NOTE — Anesthesia Postprocedure Evaluation (Signed)
Anesthesia Post Note  Patient: Cassandra Torres  Procedure(s) Performed: RIGHT BREAST LUMPECTOMY WITH BRACKETED RADIOACTIVE SEED AND SENTINEL LYMPH NODE BIOPSY (Right Breast)     Patient location during evaluation: PACU Anesthesia Type: General Level of consciousness: awake and alert and oriented Pain management: pain level controlled Vital Signs Assessment: post-procedure vital signs reviewed and stable Respiratory status: spontaneous breathing, nonlabored ventilation and respiratory function stable Cardiovascular status: blood pressure returned to baseline and stable Postop Assessment: no apparent nausea or vomiting Anesthetic complications: no    Last Vitals:  Vitals:   03/21/19 1230 03/21/19 1245  BP: 100/72 100/72  Pulse: 68 71  Resp: 10 16  Temp:    SpO2: 92% 98%    Last Pain:  Vitals:   03/21/19 1245  TempSrc:   PainSc: 1                  Dajana Gehrig A.

## 2019-03-21 NOTE — Anesthesia Preprocedure Evaluation (Signed)
Anesthesia Evaluation  Patient identified by MRN, date of birth, ID band Patient awake    Reviewed: Allergy & Precautions, NPO status , Patient's Chart, lab work & pertinent test results  Airway Mallampati: II  TM Distance: >3 FB Neck ROM: Full    Dental no notable dental hx. (+) Teeth Intact   Pulmonary neg pulmonary ROS,    Pulmonary exam normal breath sounds clear to auscultation       Cardiovascular negative cardio ROS Normal cardiovascular exam Rhythm:Regular Rate:Normal     Neuro/Psych negative neurological ROS  negative psych ROS   GI/Hepatic negative GI ROS, Neg liver ROS,   Endo/Other  Hyperthyroidism Right breast Ca Obesity  Renal/GU negative Renal ROS  negative genitourinary   Musculoskeletal negative musculoskeletal ROS (+)   Abdominal (+) + obese,   Peds  Hematology negative hematology ROS (+)   Anesthesia Other Findings   Reproductive/Obstetrics                             Anesthesia Physical Anesthesia Plan  ASA: II  Anesthesia Plan: General   Post-op Pain Management:    Induction: Intravenous  PONV Risk Score and Plan: 4 or greater and Midazolam, Scopolamine patch - Pre-op, Ondansetron, Treatment may vary due to age or medical condition and Dexamethasone  Airway Management Planned: LMA and Oral ETT  Additional Equipment:   Intra-op Plan:   Post-operative Plan: Extubation in OR  Informed Consent: I have reviewed the patients History and Physical, chart, labs and discussed the procedure including the risks, benefits and alternatives for the proposed anesthesia with the patient or authorized representative who has indicated his/her understanding and acceptance.     Dental advisory given  Plan Discussed with: CRNA and Surgeon  Anesthesia Plan Comments:         Anesthesia Quick Evaluation

## 2019-03-21 NOTE — H&P (Signed)
54 yof who had screening mm that shows right breast mass and distortion. she has no prior history. she has fh in paternal aunt with breast cancer. she has no mass or dc. MM showed 2 masses outer posterior breast. the first measures 1.2x1x1 cm by [place] and the second measures 1.1x0.7x0.5 cm in size. the two masses span 2.3 cm by ultasound. the clips are 1.2 cm apart. the axillary [place] is negative. 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 she is here with her husband to discuss options   Past Surgical History Tawni Pummel, RN; 03/07/2019 7:37 AM) Breast Biopsy  Right.  Diagnostic Studies History Tawni Pummel, RN; 03/07/2019 7:37 AM) Colonoscopy  1-5 years ago Mammogram  within last year Pap Smear  1-5 years ago  Medication History Tawni Pummel, RN; 03/07/2019 7:37 AM) Medications Reconciled  Social History Tawni Pummel, RN; 03/07/2019 7:37 AM) Alcohol use  Occasional alcohol use. Caffeine use  Coffee. No drug use  Tobacco use  Never smoker.  Family History Tawni Pummel, RN; 03/07/2019 7:37 AM) Alcohol Abuse  Brother, Sister. Arthritis  Mother. Depression  Brother, Sister. Heart Disease  Daughter, Son. Migraine Headache  Sister.  Pregnancy / Birth History Tawni Pummel, RN; 03/07/2019 7:37 AM) Age at menarche  54 years. Gravida  2 Irregular periods  Length (months) of breastfeeding  7-12 Maternal age  58-30 Para  2  Other Problems Tawni Pummel, RN; 03/07/2019 7:37 AM) Breast Cancer  Lump In Breast  Thyroid Disease     Review of Systems Tawni Pummel RN; 03/07/2019 7:37 AM) General Present- Chills. Not Present- Appetite Loss, Fatigue, Fever, Night Sweats, Weight Gain and Weight Loss. Skin Not Present- Change in Wart/Mole, Dryness, Hives, Jaundice, New Lesions, Non-Healing Wounds, Rash and Ulcer. HEENT Present- Seasonal Allergies and Wears  glasses/contact lenses. Not Present- Earache, Hearing Loss, Hoarseness, Nose Bleed, Oral Ulcers, Ringing in the Ears, Sinus Pain, Sore Throat, Visual Disturbances and Yellow Eyes. Respiratory Not Present- Bloody sputum, Chronic Cough, Difficulty Breathing, Snoring and Wheezing. Breast Present- Breast Pain. Not Present- Breast Mass, Nipple Discharge and Skin Changes. Cardiovascular Present- Leg Cramps. Not Present- Chest Pain, Difficulty Breathing Lying Down, Palpitations, Rapid Heart Rate, Shortness of Breath and Swelling of Extremities. Gastrointestinal Present- Bloating and Excessive gas. Not Present- Abdominal Pain, Bloody Stool, Change in Bowel Habits, Chronic diarrhea, Constipation, Difficulty Swallowing, Gets full quickly at meals, Hemorrhoids, Indigestion, Nausea, Rectal Pain and Vomiting. Female Genitourinary Not Present- Frequency, Nocturia, Painful Urination, Pelvic Pain and Urgency. Musculoskeletal Not Present- Back Pain, Joint Pain, Joint Stiffness, Muscle Pain, Muscle Weakness and Swelling of Extremities. Neurological Not Present- Decreased Memory, Fainting, Headaches, Numbness, Seizures, Tingling, Tremor, Trouble walking and Weakness. Psychiatric Not Present- Anxiety, Bipolar, Change in Sleep Pattern, Depression, Fearful and Frequent crying. Endocrine Not Present- Cold Intolerance, Excessive Hunger, Hair Changes, Heat Intolerance, Hot flashes and New Diabetes. Hematology Not Present- Blood Thinners, Easy Bruising, Excessive bleeding, Gland problems, HIV and Persistent Infections.   Physical Exam Rolm Bookbinder MD; 03/07/2019 7:29 PM) General Mental Status-Alert. Orientation-Oriented X3. Head and Neck Trachea-midline. Thyroid Gland Characteristics - normal size and consistency. Eye Sclera/Conjunctiva - Bilateral-No scleral icterus. Breast Nipples-No Discharge. Breast Lump-No Palpable Breast Mass. Neurologic Neurologic evaluation reveals -alert and oriented x 3  with no impairment of recent or remote memory. Lymphatic Head & Neck General Head & Neck Lymphatics: Bilateral - Description - Normal. Axillary General Axillary Region: Bilateral - Description -  Normal. Note: no [place] adenopathy   Assessment & Plan Rolm Bookbinder MD; 03/07/2019 7:32 PM) BREAST CANCER OF UPPER-OUTER QUADRANT OF RIGHT FEMALE BREAST (C50.411) Story: Right breast seed guided lumpectomy of both areas, right ax sn biopsy We discussed her breast cancer and all available treatment options. We discussed a sentinel lymph node biopsy as she does not appear to having lymph node involvement right now. We discussed the performance of that with injection of radioactive tracer at the time of surgery. We discussed up to a 5% risk lifetime of chronic shoulder pain as well as lymphedema associated with a sentinel lymph node biopsy. We discussed the options for treatment of the breast cancer which included lumpectomy versus a mastectomy. We discussed the performance of the lumpectomy with radioactive seed placement. We discussed a 5-10% chance of a positive margin requiring reexcision in the operating room. We also discussed that she will need radiation therapy if she undergoes lumpectomy. We discussed mastectomy and the postoperative care for that as well. Mastectomy can be followed by reconstruction. The decision for lumpectomy vs mastectomy has no impact on decision for chemotherapy. Most mastectomy patients will not need radiation therapy. We discussed that there is no difference in her survival whether she undergoes lumpectomy with radiation therapy or antiestrogen therapy versus a mastectomy. There is also no real difference between her recurrence in the breast. I will put two seeds in her to make sure I excise tumor as well as atypia We discussed the risks of operation including bleeding, infection, possible reoperation. She understands her further therapy will be based on what her stages at the  time of her operation.

## 2019-03-22 ENCOUNTER — Telehealth: Payer: Self-pay

## 2019-03-22 ENCOUNTER — Encounter: Payer: Self-pay | Admitting: *Deleted

## 2019-03-22 NOTE — Telephone Encounter (Signed)
Called pt to inform her that [place] sent over Disability Claims forms per [employer] forms should be completed by oncology  Pt stated she will contact [place] forms should of been sent to [facility] surgery

## 2019-03-23 LAB — SURGICAL PATHOLOGY

## 2019-03-24 ENCOUNTER — Other Ambulatory Visit (HOSPITAL_COMMUNITY): Payer: Managed Care, Other (non HMO)

## 2019-03-27 ENCOUNTER — Telehealth: Payer: Self-pay | Admitting: *Deleted

## 2019-03-27 NOTE — Telephone Encounter (Signed)
Received order for oncotype testing. Requisition faxed to pathology and [facility] °

## 2019-03-29 NOTE — Progress Notes (Signed)
Patient Care Team: Glendale Chard, MD as PCP - General (Internal Medicine) Mauro Kaufmann, RN as Oncology Nurse Navigator Rockwell Germany, RN as Oncology Nurse Navigator Rolm Bookbinder, MD as Consulting Physician (General Surgery) Nicholas Lose, MD as Consulting Physician (Hematology and Oncology) Gery Pray, MD as Consulting Physician (Radiation Oncology)  DIAGNOSIS:    ICD-10-CM   1. Malignant neoplasm of lower-outer quadrant of right breast of female, estrogen receptor positive ([place])  C50.511    Z17.0     SUMMARY OF ONCOLOGIC HISTORY: Oncology History  Malignant neoplasm of lower-outer quadrant of right breast of female, estrogen receptor positive ([employer])  03/02/2019 Initial Diagnosis   Screening mammogram showed right breast masses. Diagnostic mammogram and US showed a 1.2cm mass at the 7 o'clock position 7cm from the nipple, and a 1.1cm mass at the 7 o'clock position 6cm from the nipple, spanning 2.3cm in total, with no evidence of right axillary adenopathy. Biopsy showed IDC, grade 3, HER-2 equivocal by IHC, negative by FISH, ER+ 90%, PR+ 20%, Ki67 2%.     Genetic Testing   Negative genetic testing. No pathogenic variants identified on the [facility] Breast Cancer STAT Panel + Common Hereditary Cancers Panel + Brain Cancer Panel. VUS in KIT called [id number] identified. The report date is 03/17/2019.  The Breast Cancer STAT Panel + Common Hereditary Cancers Panel + Brain Cancer Panel offered by [facility] includes sequencing and/or deletion duplication testing of the following 70 genes: AIP, ALK, APC*, ATM*, AXIN2, BAP1, BARD1, BMPR1A, BRCA1, BRCA2, BRIP1, CDH1, CDK4, CDKN2A (p14ARF), CDKN2A (p16INK4a), CHEK2, CTNNA1, DICER1*, EPCAM*, EZH2*, GPC3*, GREM1*, HOXB13, HRAS, KIF1B*, KIT, LZTR1, MAX*, MEN1*, MLH1*, MSH2*, MSH3*, MSH6*, MUTYH, NBN, NF1*, NF2, NTHL1, PALB2, PDGFRA, PHOX2B*, PMS2*, POLD1*, POLE, POT1, PRKAR1A, PTCH1, PTCH2, PTEN*, RAD50, RAD51C, RAD51D, RB1*, RET, RNF43, SDHA*,  SDHAF2, SDHB, SDHC*, SDHD, SMAD4, SMARCA4, SMARCB1, SMARCE1, STK11, SUFU, TMEM127, TP53, TSC1*, TSC2, VHL.   03/21/2019 Surgery   Right lumpectomy [name]): IDC, 2.5cm, grade 2, 4 right axillary lymph nodes negative for carcinoma.   03/30/2019 Cancer Staging   Staging form: Breast, AJCC 8th Edition - Pathologic stage from 03/30/2019: Stage IA (pT2, pN0, cM0, G2, ER+, PR+, HER2-) - Signed by Nicholas Lose, MD on 03/30/2019     CHIEF COMPLIANT: Follow-up s/p lumpectomy to discuss pathology report   INTERVAL HISTORY: Cassandra Torres is a 54 y.o. with above-mentioned history of right breast cancer. She underwent a right lumpectomy on 03/21/19 with Dr. [name] for which pathology showed invasive ductal carcinoma, 2.5cm, grade 2, 4 right axillary lymph nodes negative for carcinoma. She presents to the clinic today to discuss the pathology report and further treatment.   ALLERGIES:  is allergic to levaquin [levofloxacin]; penicillins; and sulfur.  MEDICATIONS:  Current Outpatient Medications  Medication Sig Dispense Refill  . diphenhydrAMINE (BENADRYL) 25 mg capsule Take 25 mg by mouth as needed.    Marland Kitchen ELDERBERRY PO Take 50 mg by mouth daily.    Marland Kitchen loratadine (CLARITIN) 10 MG tablet Take by mouth.    . Multiple Vitamin (MULTIVITAMINS PO) Take by mouth.    . oxyCODONE (OXY IR/ROXICODONE) 5 MG immediate release tablet Take 1 tablet (5 mg total) by mouth every 6 (six) hours as needed for moderate pain, severe pain or breakthrough pain. 10 tablet 0   No current facility-administered medications for this visit.    PHYSICAL EXAMINATION: ECOG PERFORMANCE STATUS: 1 - Symptomatic but completely ambulatory  Vitals:   03/30/19 1008  BP: 119/75  Pulse: 99  Resp: 20  Temp: 98 F (36.7 C)  SpO2: 97%   Filed Weights   03/30/19 1008  Weight: 197 lb 11.2 oz (89.7 kg)    LABORATORY DATA:  I have reviewed the data as listed CMP Latest Ref Rng & Units 03/07/2019 02/12/2019 01/30/2018  Glucose 70 - 99  mg/dL 90 79 81  BUN 6 - 20 mg/dL 9 10 10   Creatinine 0.44 - 1.00 mg/dL 0.82 0.84 0.85  Sodium 135 - 145 mmol/L 141 138 136  Potassium 3.5 - 5.1 mmol/L 4.0 4.2 3.9  Chloride 98 - 111 mmol/L 105 105 101  CO2 22 - 32 mmol/L 25 21 21   Calcium 8.9 - 10.3 mg/dL 9.3 9.0 8.8  Total Protein 6.5 - 8.1 g/dL 8.0 7.0 6.9  Total Bilirubin 0.3 - 1.2 mg/dL 0.2(L) 0.3 0.2  Alkaline Phos 38 - 126 U/L 97 99 86  AST 15 - 41 U/L 20 17 15   ALT 0 - 44 U/L 26 18 19     Lab Results  Component Value Date   WBC 8.3 03/07/2019   HGB 13.2 03/07/2019   HCT 40.8 03/07/2019   MCV 88.5 03/07/2019   PLT 227 03/07/2019   NEUTROABS 5.3 03/07/2019    ASSESSMENT & PLAN:  Malignant neoplasm of lower-outer quadrant of right breast of female, estrogen receptor positive ([place]) 03/21/2019:Right lumpectomy [name]): IDC, 2.5cm, grade 2, 4 right axillary lymph nodes negative for carcinoma.  ER 90%, PR 20%, Ki-67 2%, HER-2 negative T2N0 stage Ia Genetic testing: Negative  Pathology counseling: I discussed the final pathology report of the patient provided  a copy of this report. I discussed the margins as well as lymph node surgeries. We also discussed the final staging along with previously performed ER/PR and HER-2/neu testing.  Recommendation:  Margin resection is necessary because of the positive posterior medial margins 1.  Oncotype DX testing to determine if patient would benefit from chemotherapy 2.  Adjuvant radiation 3.  Adjuvant antiestrogen therapy  Return to clinic based upon Oncotype test result.  No orders of the defined types were placed in this encounter.  The patient has a good understanding of the overall plan. she agrees with it. she will call with any problems that may develop before the next visit here.  Total time spent: 30 mins including face to face time and time spent for planning, charting and coordination of care  Nicholas Lose, MD 03/30/2019  I, Cloyde Reams Dorshimer, am acting as scribe  for Dr. Nicholas Lose.  I have reviewed the above documentation for accuracy and completeness, and I agree with the above.

## 2019-03-30 ENCOUNTER — Inpatient Hospital Stay: Payer: 59 | Attending: Hematology and Oncology | Admitting: Hematology and Oncology

## 2019-03-30 ENCOUNTER — Other Ambulatory Visit: Payer: Self-pay

## 2019-03-30 DIAGNOSIS — Z170 Estrogen receptor positive status [ER+]: Secondary | ICD-10-CM | POA: Insufficient documentation

## 2019-03-30 DIAGNOSIS — C50511 Malignant neoplasm of lower-outer quadrant of right female breast: Secondary | ICD-10-CM | POA: Diagnosis present

## 2019-03-30 NOTE — Assessment & Plan Note (Addendum)
03/21/2019:Right lumpectomy Cassandra Torres): IDC, 2.5cm, grade 2, 4 right axillary lymph nodes negative for carcinoma.  ER 90%, PR 20%, Ki-67 2%, HER-2 negative T2N0 stage Ia Genetic testing: Negative  Pathology counseling: I discussed the final pathology report of the patient provided  a copy of this report. I discussed the margins as well as lymph node surgeries. We also discussed the final staging along with previously performed ER/PR and HER-2/neu testing.  Recommendation: 1.  Oncotype DX testing to determine if patient would benefit from chemotherapy 2.  Adjuvant radiation 3.  Adjuvant antiestrogen therapy  Return to clinic based upon Oncotype test result.

## 2019-04-03 ENCOUNTER — Telehealth: Payer: Self-pay | Admitting: *Deleted

## 2019-04-03 NOTE — Telephone Encounter (Signed)
Received oncotype results of 14/4%.  Patient is aware.  Patient will need re-excision and will refer to dr. Sondra Come after surgery.

## 2019-04-05 ENCOUNTER — Other Ambulatory Visit: Payer: Self-pay | Admitting: General Surgery

## 2019-04-09 ENCOUNTER — Other Ambulatory Visit: Payer: Self-pay | Admitting: *Deleted

## 2019-04-09 DIAGNOSIS — C50511 Malignant neoplasm of lower-outer quadrant of right female breast: Secondary | ICD-10-CM

## 2019-04-12 ENCOUNTER — Encounter: Payer: Self-pay | Admitting: *Deleted

## 2019-04-19 ENCOUNTER — Ambulatory Visit: Payer: 59 | Admitting: Physical Therapy

## 2019-04-20 ENCOUNTER — Other Ambulatory Visit: Payer: Self-pay

## 2019-04-20 ENCOUNTER — Encounter (HOSPITAL_BASED_OUTPATIENT_CLINIC_OR_DEPARTMENT_OTHER): Payer: Self-pay | Admitting: General Surgery

## 2019-04-23 ENCOUNTER — Encounter: Payer: Self-pay | Admitting: Hematology and Oncology

## 2019-04-23 ENCOUNTER — Other Ambulatory Visit: Payer: Self-pay

## 2019-04-23 ENCOUNTER — Ambulatory Visit: Payer: 59 | Attending: General Surgery | Admitting: Physical Therapy

## 2019-04-23 ENCOUNTER — Encounter: Payer: Self-pay | Admitting: Physical Therapy

## 2019-04-23 DIAGNOSIS — R293 Abnormal posture: Secondary | ICD-10-CM | POA: Diagnosis present

## 2019-04-23 DIAGNOSIS — Z483 Aftercare following surgery for neoplasm: Secondary | ICD-10-CM

## 2019-04-23 DIAGNOSIS — Z170 Estrogen receptor positive status [ER+]: Secondary | ICD-10-CM | POA: Insufficient documentation

## 2019-04-23 DIAGNOSIS — C50511 Malignant neoplasm of lower-outer quadrant of right female breast: Secondary | ICD-10-CM

## 2019-04-23 NOTE — Therapy (Signed)
[place], [place], [address] Phone: [phone]   Fax:  [phone]  Physical Therapy Treatment  Patient Details  Name: Cassandra Torres MRN: [id number] Date of Birth: [birth year] Referring Provider (PT): Dr. Rolm Bookbinder   Encounter Date: 04/23/2019  PT End of Session - 04/23/19 1337    Visit Number  2    Number of Visits  2    PT Start Time  1304    PT Stop Time  1337    PT Time Calculation (min)  33 min    Activity Tolerance  Patient tolerated treatment well    Behavior During Therapy  [facility] for tasks assessed/performed       Past Medical History:  Diagnosis Date  . Cancer ([place]) 02/2019   right breast IDC  . Family history of brain cancer   . Family history of breast cancer   . Family history of prostate cancer   . PONV (postoperative nausea and vomiting)   . Seasonal allergies   . Thyrotoxicosis     Past Surgical History:  Procedure Laterality Date  . BREAST LUMPECTOMY WITH RADIOACTIVE SEED AND SENTINEL LYMPH NODE BIOPSY Right 03/21/2019   Procedure: RIGHT BREAST LUMPECTOMY WITH BRACKETED RADIOACTIVE SEED AND SENTINEL LYMPH NODE BIOPSY;  Surgeon: Rolm Bookbinder, MD;  Location: [facility];  Service: General;  Laterality: Right;  . COLONOSCOPY    . COLPOSCOPY      There were no vitals filed for this visit.  Subjective Assessment - 04/23/19 1309    Subjective  Patient reports she underwent a right lumpectomy and sentinel node biopsy (4 negative nodes removed) on 03/21/2019. She reports a wound developed at her breast incision site the week after surgery and she having to pack that wound. She took 10 days of antibiotics. She is scheduled for a re-excision on 04/30/2019 to obtain clear margins. She will undergo radiation and anti-estrogen therapy.    Pertinent History  Patient was diagnosed on 02/01/2019 with right grade III invasive ductal carcinoma breast cancer. It is ER/PR  positive and HER2 negative with a Ki67 of 2%. Patient underwent a right lumpectomy and sentinel node biopsy (4 negative nodes removed) on 03/21/2019.    Patient Stated Goals  See how my arm is doing    Currently in Pain?  No/denies         [facility] PT Assessment - 04/23/19 0001      Assessment   Medical Diagnosis  s/p right lumpectomy and SLNB    Referring Provider (PT)  Dr. Rolm Bookbinder    Onset Date/Surgical Date  03/21/19    Hand Dominance  Left    Prior Therapy  Baselines      Precautions   Precautions  Other (comment)    Precaution Comments  recent surgery; right arm lymphedema risk      Restrictions   Weight Bearing Restrictions  No      Balance Screen   Has the patient fallen in the past 6 months  No    Has the patient had a decrease in activity level because of a fear of falling?   No    Is the patient reluctant to leave their home because of a fear of falling?   No      Home Environment   Living Environment  Private residence    Living Arrangements  Spouse/significant other;Children   Husband and 53 y.o. son   Available Help at Discharge  Family  Prior Function   Level of Independence  Independent    Vocation  Full time employment    Vocation Requirements  Currently working from home    Leisure  She does not exercise      Cognition   Overall Cognitive Status  Within Functional Limits for tasks assessed      Observation/Other Assessments   Observations  Axillary incision appears completely healed with some bumpiness due to scar tissue. Breast incision coverage with dressing due to wound so that was not assessed.      Posture/Postural Control   Posture/Postural Control  Postural limitations    Postural Limitations  Rounded Shoulders;Forward head      ROM / Strength   AROM / PROM / Strength  AROM      AROM   AROM Assessment Site  Shoulder    Right/Left Shoulder  Right    Right Shoulder Extension  52 Degrees    Right Shoulder Flexion  141 Degrees     Right Shoulder ABduction  142 Degrees    Right Shoulder Internal Rotation  64 Degrees    Right Shoulder External Rotation  89 Degrees      Strength   Overall Strength  Within functional limits for tasks performed        LYMPHEDEMA/ONCOLOGY QUESTIONNAIRE - 04/23/19 1314      Type   Cancer Type  Right breast cancer      Surgeries   Lumpectomy Date  03/21/19    Sentinel Lymph Node Biopsy Date  03/21/19    Number Lymph Nodes Removed  2      Treatment   Active Chemotherapy Treatment  No    Past Chemotherapy Treatment  No    Active Radiation Treatment  No    Past Radiation Treatment  No    Current Hormone Treatment  No    Past Hormone Therapy  No      What other symptoms do you have   Are you Having Heaviness or Tightness  No    Are you having Pain  No    Are you having pitting edema  No    Is it Hard or Difficult finding clothes that fit  No    Do you have infections  Yes    Comments  breast incision infection; antibiotics complete    Is there Decreased scar mobility  Yes    Stemmer Sign  No      Right Upper Extremity Lymphedema   10 cm Proximal to Olecranon Process  29.3 cm    Olecranon Process  26.5 cm    10 cm Proximal to Ulnar Styloid Process  22.8 cm    Just Proximal to Ulnar Styloid Process  16 cm    Across Hand at [employer]  18.8 cm    At [place] of 2nd Digit  5.9 cm      Left Upper Extremity Lymphedema   10 cm Proximal to Olecranon Process  29 cm    Olecranon Process  26 cm    10 cm Proximal to Ulnar Styloid Process  21.6 cm    Just Proximal to Ulnar Styloid Process  16 cm    Across Hand at [employer]  17.7 cm    At [place] of 2nd Digit  5.8 cm        Quick Dash - 04/23/19 0001    Open a tight or new jar  No difficulty    Do heavy household chores (wash walls,  wash floors)  Mild difficulty    Carry a shopping bag or briefcase  No difficulty    Wash your back  No difficulty    Use a knife to cut food  No difficulty    Recreational activities in  which you take some force or impact through your arm, shoulder, or hand (golf, hammering, tennis)  Mild difficulty    During the past week, to what extent has your arm, shoulder or hand problem interfered with your normal social activities with family, friends, neighbors, or groups?  Not at all    During the past week, to what extent has your arm, shoulder or hand problem limited your work or other regular daily activities  Not at all    Arm, shoulder, or hand pain.  None    Tingling (pins and needles) in your arm, shoulder, or hand  None    Difficulty Sleeping  No difficulty    DASH Score  4.55 %                     PT Education - 04/23/19 1332    Education Details  Reviewed HEP to be done after re-excision; scar massage using coconut oil for axillary incision and after wound heals, for breast incision    Person(s) Educated  Patient    Methods  Explanation    Comprehension  Verbalized understanding          PT Long Term Goals - 04/23/19 1342      PT LONG TERM GOAL #1   Title  Patient will demonstrate she has regained full shoulder ROM and function post operatively compared to baselines.    Time  8    Period  Weeks    Status  Partially Met            Plan - 04/23/19 1337    Clinical Impression Statement  Patient is doing well functionally s/p right lumpectomy and sentinel node biopsy. She will undergo a re-excision on 04/30/2019 to obtain clear margins. She developed a wound at her breast incision site so she has been dealing with that and packing it regularly. Her axillary incision appears to be well healed. Her shoulder ROM is nearly back to baseline except about 10 degrees of abduction. She appears to be slightly limited with abduction due to her breast wound. She has no signs of lymphedema. She plans to participate in the After Breast Cancer class to be educated on lymphedema risk reduction but otherwise has no further PT needs at this time.    PT  Treatment/Interventions  ADLs/Self Care Home Management;Therapeutic exercise;Patient/family education    PT Next Visit Plan  D/C    PT Home Exercise Plan  Post op shoulder ROM HEP    Consulted and Agree with Plan of Care  Patient       Patient will benefit from skilled therapeutic intervention in order to improve the following deficits and impairments:  Postural dysfunction, Decreased range of motion, Pain, Impaired UE functional use, Decreased knowledge of precautions  Visit Diagnosis: Malignant neoplasm of lower-outer quadrant of right breast of female, estrogen receptor positive (HCC)  Abnormal posture  Aftercare following surgery for neoplasm     Problem List Patient Active Problem List   Diagnosis Date Noted  . Genetic testing 03/19/2019  . Family history of breast cancer   . Family history of prostate cancer   . Family history of brain cancer   . Malignant neoplasm of lower-outer quadrant  of right breast of female, estrogen receptor positive ([place]) 03/02/2019   PHYSICAL THERAPY DISCHARGE SUMMARY  Visits from Start of Care: 2  Current functional level related to goals / functional outcomes: Goals met except pt is slightly limited with right shoulder abduction due to wound. See above for objective findings.   Remaining deficits: Limited right shoulder abduction by 10 degrees.   Education / Equipment: Lymphedema risk reduction and scar massage Plan: Patient agrees to discharge.  Patient goals were partially met. Patient is being discharged due to being pleased with the current functional level.  ?????          Annia Friendly, [place] 04/23/19 1:44 PM  [place], [place], [address] Phone: [phone]   Fax:  [phone]  Name: Juanitta Earnhardt MRN: [id number] Date of Birth: [birth year]

## 2019-04-26 ENCOUNTER — Other Ambulatory Visit (HOSPITAL_COMMUNITY)
Admission: RE | Admit: 2019-04-26 | Discharge: 2019-04-26 | Disposition: A | Discharge status: Home / Self Care | Payer: 59 | Source: Ambulatory Visit | Attending: General Surgery | Admitting: General Surgery

## 2019-04-26 DIAGNOSIS — Z20822 Contact with and (suspected) exposure to covid-19: Secondary | ICD-10-CM | POA: Diagnosis not present

## 2019-04-26 DIAGNOSIS — Z01812 Encounter for preprocedural laboratory examination: Secondary | ICD-10-CM | POA: Diagnosis present

## 2019-04-26 LAB — SARS CORONAVIRUS 2 (TAT 6-24 HRS): SARS Coronavirus 2: NEGATIVE

## 2019-04-26 MED ORDER — ENSURE PRE-SURGERY PO LIQD: 296.0000 mL | Freq: Once | ORAL | Status: DC

## 2019-04-26 NOTE — Progress Notes (Signed)

## 2019-04-30 ENCOUNTER — Encounter (HOSPITAL_BASED_OUTPATIENT_CLINIC_OR_DEPARTMENT_OTHER)
Admission: RE | Disposition: A | Discharge status: Home / Self Care | Payer: Self-pay | Source: Home / Self Care | Attending: General Surgery

## 2019-04-30 ENCOUNTER — Ambulatory Visit (HOSPITAL_BASED_OUTPATIENT_CLINIC_OR_DEPARTMENT_OTHER): Payer: 59 | Admitting: Anesthesiology

## 2019-04-30 ENCOUNTER — Encounter (HOSPITAL_BASED_OUTPATIENT_CLINIC_OR_DEPARTMENT_OTHER): Payer: Self-pay | Admitting: General Surgery

## 2019-04-30 ENCOUNTER — Ambulatory Visit (HOSPITAL_BASED_OUTPATIENT_CLINIC_OR_DEPARTMENT_OTHER)
Admission: RE | Admit: 2019-04-30 | Discharge: 2019-04-30 | Disposition: A | Discharge status: Home / Self Care | Payer: 59 | Attending: General Surgery | Admitting: General Surgery

## 2019-04-30 ENCOUNTER — Other Ambulatory Visit: Payer: Self-pay

## 2019-04-30 DIAGNOSIS — E0590 Thyrotoxicosis, unspecified without thyrotoxic crisis or storm: Secondary | ICD-10-CM | POA: Insufficient documentation

## 2019-04-30 DIAGNOSIS — Z8261 Family history of arthritis: Secondary | ICD-10-CM | POA: Insufficient documentation

## 2019-04-30 DIAGNOSIS — E669 Obesity, unspecified: Secondary | ICD-10-CM | POA: Diagnosis not present

## 2019-04-30 DIAGNOSIS — N62 Hypertrophy of breast: Secondary | ICD-10-CM | POA: Diagnosis not present

## 2019-04-30 DIAGNOSIS — Z6828 Body mass index (BMI) 28.0-28.9, adult: Secondary | ICD-10-CM | POA: Insufficient documentation

## 2019-04-30 DIAGNOSIS — Z8249 Family history of ischemic heart disease and other diseases of the circulatory system: Secondary | ICD-10-CM | POA: Diagnosis not present

## 2019-04-30 DIAGNOSIS — Z811 Family history of alcohol abuse and dependence: Secondary | ICD-10-CM | POA: Insufficient documentation

## 2019-04-30 DIAGNOSIS — Z820 Family history of epilepsy and other diseases of the nervous system: Secondary | ICD-10-CM | POA: Diagnosis not present

## 2019-04-30 DIAGNOSIS — Z818 Family history of other mental and behavioral disorders: Secondary | ICD-10-CM | POA: Diagnosis not present

## 2019-04-30 DIAGNOSIS — C50411 Malignant neoplasm of upper-outer quadrant of right female breast: Secondary | ICD-10-CM | POA: Insufficient documentation

## 2019-04-30 HISTORY — PX: RE-EXCISION OF BREAST CANCER,SUPERIOR MARGINS: SHX6047

## 2019-04-30 HISTORY — DX: Other specified postprocedural states: Z98.890

## 2019-04-30 HISTORY — DX: Nausea with vomiting, unspecified: R11.2

## 2019-04-30 LAB — POCT PREGNANCY, URINE: Preg Test, Ur: NEGATIVE

## 2019-04-30 SURGERY — RE-EXCISION OF BREAST CANCER,SUPERIOR MARGINS
Anesthesia: General | Site: Breast | Laterality: Right

## 2019-04-30 MED ORDER — FENTANYL CITRATE (PF) 100 MCG/2ML IJ SOLN
INTRAMUSCULAR | Status: AC
  Filled 2019-04-30: qty 2

## 2019-04-30 MED ORDER — GABAPENTIN 100 MG PO CAPS
100.0000 mg | ORAL_CAPSULE | ORAL | Status: AC
  Administered 2019-04-30: 11:00:00 100 mg via ORAL

## 2019-04-30 MED ORDER — SCOPOLAMINE 1 MG/3DAYS TD PT72
1.0000 | MEDICATED_PATCH | TRANSDERMAL | Status: DC
  Administered 2019-04-30: 11:00:00 1.5 mg via TRANSDERMAL

## 2019-04-30 MED ORDER — LACTATED RINGERS IV SOLN: INTRAVENOUS | Status: DC

## 2019-04-30 MED ORDER — ACETAMINOPHEN 500 MG PO TABS
1000.0000 mg | ORAL_TABLET | Freq: Once | ORAL | Status: AC
  Administered 2019-04-30: 1000 mg via ORAL

## 2019-04-30 MED ORDER — PROPOFOL 500 MG/50ML IV EMUL
INTRAVENOUS | Status: AC
  Filled 2019-04-30: qty 50

## 2019-04-30 MED ORDER — CLINDAMYCIN PHOSPHATE 900 MG/50ML IV SOLN
INTRAVENOUS | Status: AC
  Filled 2019-04-30: qty 50

## 2019-04-30 MED ORDER — ONDANSETRON HCL 4 MG/2ML IJ SOLN
INTRAMUSCULAR | Status: AC
  Filled 2019-04-30: qty 2

## 2019-04-30 MED ORDER — GABAPENTIN 100 MG PO CAPS
ORAL_CAPSULE | ORAL | Status: AC
  Filled 2019-04-30: qty 1

## 2019-04-30 MED ORDER — OXYCODONE HCL 5 MG PO TABS: 5.0000 mg | ORAL_TABLET | Freq: Four times a day (QID) | ORAL | 0 refills | Status: DC | PRN

## 2019-04-30 MED ORDER — OXYCODONE HCL 5 MG PO TABS: 5.0000 mg | ORAL_TABLET | Freq: Once | ORAL | Status: DC | PRN

## 2019-04-30 MED ORDER — LIDOCAINE 2% (20 MG/ML) 5 ML SYRINGE
INTRAMUSCULAR | Status: AC
  Filled 2019-04-30: qty 5

## 2019-04-30 MED ORDER — SCOPOLAMINE 1 MG/3DAYS TD PT72
MEDICATED_PATCH | TRANSDERMAL | Status: AC
  Filled 2019-04-30: qty 1

## 2019-04-30 MED ORDER — PROMETHAZINE HCL 25 MG/ML IJ SOLN: 6.2500 mg | INTRAMUSCULAR | Status: DC | PRN

## 2019-04-30 MED ORDER — LIDOCAINE 2% (20 MG/ML) 5 ML SYRINGE
INTRAMUSCULAR | Status: DC | PRN
  Administered 2019-04-30: 100 mg via INTRAVENOUS

## 2019-04-30 MED ORDER — PROPOFOL 10 MG/ML IV BOLUS
INTRAVENOUS | Status: DC | PRN
  Administered 2019-04-30: 200 mg via INTRAVENOUS

## 2019-04-30 MED ORDER — MEPERIDINE HCL 25 MG/ML IJ SOLN: 6.2500 mg | INTRAMUSCULAR | Status: DC | PRN

## 2019-04-30 MED ORDER — ONDANSETRON HCL 4 MG/2ML IJ SOLN
INTRAMUSCULAR | Status: DC | PRN
  Administered 2019-04-30: 4 mg via INTRAVENOUS

## 2019-04-30 MED ORDER — DEXAMETHASONE SODIUM PHOSPHATE 4 MG/ML IJ SOLN
INTRAMUSCULAR | Status: DC | PRN
  Administered 2019-04-30: 10 mg via INTRAVENOUS

## 2019-04-30 MED ORDER — MIDAZOLAM HCL 2 MG/2ML IJ SOLN
INTRAMUSCULAR | Status: AC
  Filled 2019-04-30: qty 2

## 2019-04-30 MED ORDER — PROPOFOL 500 MG/50ML IV EMUL
INTRAVENOUS | Status: DC | PRN
  Administered 2019-04-30: 150 ug/kg/min via INTRAVENOUS

## 2019-04-30 MED ORDER — OXYCODONE HCL 5 MG/5ML PO SOLN: 5.0000 mg | Freq: Once | ORAL | Status: DC | PRN

## 2019-04-30 MED ORDER — CLINDAMYCIN PHOSPHATE 900 MG/50ML IV SOLN
900.0000 mg | INTRAVENOUS | Status: AC
  Administered 2019-04-30: 900 mg via INTRAVENOUS

## 2019-04-30 MED ORDER — MIDAZOLAM HCL 2 MG/2ML IJ SOLN
1.0000 mg | INTRAMUSCULAR | Status: DC | PRN
  Administered 2019-04-30: 2 mg via INTRAVENOUS

## 2019-04-30 MED ORDER — BUPIVACAINE HCL (PF) 0.25 % IJ SOLN
INTRAMUSCULAR | Status: DC | PRN
  Administered 2019-04-30: 10 mL

## 2019-04-30 MED ORDER — KETOROLAC TROMETHAMINE 15 MG/ML IJ SOLN
INTRAMUSCULAR | Status: AC
  Filled 2019-04-30: qty 1

## 2019-04-30 MED ORDER — ACETAMINOPHEN 500 MG PO TABS
ORAL_TABLET | ORAL | Status: AC
  Filled 2019-04-30: qty 2

## 2019-04-30 MED ORDER — FENTANYL CITRATE (PF) 100 MCG/2ML IJ SOLN
50.0000 ug | INTRAMUSCULAR | Status: DC | PRN
  Administered 2019-04-30: 50 ug via INTRAVENOUS

## 2019-04-30 MED ORDER — DEXAMETHASONE SODIUM PHOSPHATE 10 MG/ML IJ SOLN
INTRAMUSCULAR | Status: AC
  Filled 2019-04-30: qty 1

## 2019-04-30 MED ORDER — KETOROLAC TROMETHAMINE 15 MG/ML IJ SOLN
15.0000 mg | INTRAMUSCULAR | Status: AC
  Administered 2019-04-30: 15 mg via INTRAVENOUS

## 2019-04-30 MED ORDER — PHENYLEPHRINE HCL (PRESSORS) 10 MG/ML IV SOLN
INTRAVENOUS | Status: DC | PRN
  Administered 2019-04-30 (×3): 80 ug via INTRAVENOUS

## 2019-04-30 MED ORDER — HYDROMORPHONE HCL 1 MG/ML IJ SOLN: 0.2500 mg | INTRAMUSCULAR | Status: DC | PRN

## 2019-04-30 MED ORDER — KETOROLAC TROMETHAMINE 30 MG/ML IJ SOLN: 30.0000 mg | Freq: Once | INTRAMUSCULAR | Status: DC | PRN

## 2019-04-30 MED ORDER — PROPOFOL 10 MG/ML IV BOLUS
INTRAVENOUS | Status: AC
  Filled 2019-04-30: qty 20

## 2019-04-30 MED ORDER — ACETAMINOPHEN 500 MG PO TABS: 1000.0000 mg | ORAL_TABLET | ORAL | Status: DC

## 2019-04-30 SURGICAL SUPPLY — 60 items
APPLIER CLIP 9.375 MED OPEN (MISCELLANEOUS) ×3
BINDER BREAST LRG (GAUZE/BANDAGES/DRESSINGS) IMPLANT
BINDER BREAST MEDIUM (GAUZE/BANDAGES/DRESSINGS) IMPLANT
BINDER BREAST XLRG (GAUZE/BANDAGES/DRESSINGS) ×3 IMPLANT
BINDER BREAST XXLRG (GAUZE/BANDAGES/DRESSINGS) IMPLANT
BLADE SURG 15 STRL LF DISP TIS (BLADE) ×1 IMPLANT
BLADE SURG 15 STRL SS (BLADE) ×3
CANISTER SUCT 1200ML W/VALVE (MISCELLANEOUS) ×3 IMPLANT
CHLORAPREP W/TINT 26 (MISCELLANEOUS) IMPLANT
CLIP APPLIE 9.375 MED OPEN (MISCELLANEOUS) ×1 IMPLANT
CLIP VESOCCLUDE SM WIDE 6/CT (CLIP) IMPLANT
CLOSURE WOUND 1/2 X4 (GAUZE/BANDAGES/DRESSINGS) ×1
COVER BACK TABLE 60X90IN (DRAPES) ×3 IMPLANT
COVER MAYO STAND STRL (DRAPES) ×3 IMPLANT
COVER WAND RF STERILE (DRAPES) IMPLANT
DECANTER SPIKE VIAL GLASS SM (MISCELLANEOUS) IMPLANT
DERMABOND ADVANCED (GAUZE/BANDAGES/DRESSINGS) ×2
DERMABOND ADVANCED .7 DNX12 (GAUZE/BANDAGES/DRESSINGS) ×1 IMPLANT
DRAPE LAPAROSCOPIC ABDOMINAL (DRAPES) ×3 IMPLANT
DRAPE UTILITY XL STRL (DRAPES) ×3 IMPLANT
DRSG TEGADERM 4X4.75 (GAUZE/BANDAGES/DRESSINGS) ×3 IMPLANT
ELECT COATED BLADE 2.86 ST (ELECTRODE) ×3 IMPLANT
ELECT REM PT RETURN 9FT ADLT (ELECTROSURGICAL) ×3
ELECTRODE REM PT RTRN 9FT ADLT (ELECTROSURGICAL) ×1 IMPLANT
GAUZE SPONGE 4X4 12PLY STRL LF (GAUZE/BANDAGES/DRESSINGS) IMPLANT
GLOVE BIO SURGEON STRL SZ7 (GLOVE) ×3 IMPLANT
GLOVE BIOGEL PI IND STRL 7.0 (GLOVE) ×2 IMPLANT
GLOVE BIOGEL PI IND STRL 7.5 (GLOVE) ×2 IMPLANT
GLOVE BIOGEL PI INDICATOR 7.0 (GLOVE) ×4
GLOVE BIOGEL PI INDICATOR 7.5 (GLOVE) ×4
GLOVE ECLIPSE 6.5 STRL STRAW (GLOVE) ×3 IMPLANT
GLOVE SURG SS PI 7.0 STRL IVOR (GLOVE) ×3 IMPLANT
GOWN STRL REUS W/ TWL LRG LVL3 (GOWN DISPOSABLE) ×3 IMPLANT
GOWN STRL REUS W/TWL LRG LVL3 (GOWN DISPOSABLE) ×9
NEEDLE HYPO 25X1 1.5 SAFETY (NEEDLE) ×3 IMPLANT
NS IRRIG 1000ML POUR BTL (IV SOLUTION) ×3 IMPLANT
PACK BASIN DAY SURGERY FS (CUSTOM PROCEDURE TRAY) ×3 IMPLANT
PENCIL SMOKE EVACUATOR (MISCELLANEOUS) ×3 IMPLANT
RETRACTOR ONETRAX LX 90X20 (MISCELLANEOUS) ×3 IMPLANT
SLEEVE SCD COMPRESS KNEE MED (MISCELLANEOUS) ×3 IMPLANT
SPONGE LAP 4X18 RFD (DISPOSABLE) ×9 IMPLANT
STRIP CLOSURE SKIN 1/2X4 (GAUZE/BANDAGES/DRESSINGS) ×2 IMPLANT
SUT ETHILON 3 0 PS 1 (SUTURE) ×3 IMPLANT
SUT MNCRL AB 3-0 PS2 18 (SUTURE) IMPLANT
SUT MNCRL AB 4-0 PS2 18 (SUTURE) ×3 IMPLANT
SUT MON AB 5-0 PS2 18 (SUTURE) IMPLANT
SUT SILK 2 0 SH (SUTURE) IMPLANT
SUT VIC AB 2-0 SH 27 (SUTURE) ×6
SUT VIC AB 2-0 SH 27XBRD (SUTURE) ×2 IMPLANT
SUT VIC AB 3-0 SH 27 (SUTURE) ×3
SUT VIC AB 3-0 SH 27X BRD (SUTURE) ×1 IMPLANT
SUT VIC AB 5-0 PS2 18 (SUTURE) IMPLANT
SUT VICRYL AB 3 0 TIES (SUTURE) IMPLANT
SYR CONTROL 10ML LL (SYRINGE) ×3 IMPLANT
TOWEL GREEN STERILE FF (TOWEL DISPOSABLE) ×3 IMPLANT
TRAY DSU PREP LF (CUSTOM PROCEDURE TRAY) ×3 IMPLANT
TRAY FAXITRON CT DISP (TRAY / TRAY PROCEDURE) IMPLANT
TUBE CONNECTING 20'X1/4 (TUBING) ×1
TUBE CONNECTING 20X1/4 (TUBING) ×2 IMPLANT
YANKAUER SUCT BULB TIP NO VENT (SUCTIONS) ×3 IMPLANT

## 2019-04-30 NOTE — Discharge Instructions (Signed)
[facility] Office Phone Number [phone]  BREAST BIOPSY/ PARTIAL MASTECTOMY: POST OP INSTRUCTIONS Take 400 mg of ibuprofen every 8 hours or 650 mg tylenol every 6 hours for next 72 hours then as needed. Use ice several times daily also. Always review your discharge instruction sheet given to you by the facility where your surgery was performed.  IF YOU HAVE DISABILITY OR FAMILY LEAVE FORMS, YOU MUST BRING THEM TO THE OFFICE FOR PROCESSING.  DO NOT GIVE THEM TO YOUR DOCTOR.  1. A prescription for pain medication may be given to you upon discharge.  Take your pain medication as prescribed, if needed.  If narcotic pain medicine is not needed, then you may take acetaminophen (Tylenol), naprosyn (Alleve) or ibuprofen (Advil) as needed. 2. Take your usually prescribed medications unless otherwise directed 3. If you need a refill on your pain medication, please contact your pharmacy.  They will contact our office to request authorization.  Prescriptions will not be filled after 5pm or on week-ends. 4. You should eat very light the first 24 hours after surgery, such as soup, crackers, pudding, etc.  Resume your normal diet the day after surgery. 5. Most patients will experience some swelling and bruising in the breast.  Ice packs and a good support bra will help.  Wear the breast binder provided or a sports bra for 72 hours day and night.  After that wear a sports bra during the day until you return to the office. Swelling and bruising can take several days to resolve.  6. It is common to experience some constipation if taking pain medication after surgery.  Increasing fluid intake and taking a stool softener will usually help or prevent this problem from occurring.  A mild laxative (Milk of Magnesia or Miralax) should be taken according to package directions if there are no bowel movements after 48 hours. 7. Unless discharge instructions indicate otherwise, you may remove your bandages 48  hours after surgery and you may shower at that time.  You may have steri-strips (small skin tapes) in place directly over the incision.  These strips should be left on the skin for 7-10 days and will come off on their own.  If your surgeon used skin glue on the incision, you may shower in 24 hours.  The glue will flake off over the next 2-3 weeks.  Any sutures or staples will be removed at the office during your follow-up visit. 8. ACTIVITIES:  You may resume regular daily activities (gradually increasing) beginning the next day.  Wearing a good support bra or sports bra minimizes pain and swelling.  You may have sexual intercourse when it is comfortable. a. You may drive when you no longer are taking prescription pain medication, you can comfortably wear a seatbelt, and you can safely maneuver your car and apply brakes. b. RETURN TO WORK:  ______________________________________________________________________________________ 9. You should see your doctor in the office for a follow-up appointment approximately two weeks after your surgery.  Your doctor's nurse will typically make your follow-up appointment when she calls you with your pathology report.  Expect your pathology report 3-4 business days after your surgery.  You may call to check if you do not hear from us after three days. 10. OTHER INSTRUCTIONS: _______________________________________________________________________________________________ _____________________________________________________________________________________________________________________________________ _____________________________________________________________________________________________________________________________________ _____________________________________________________________________________________________________________________________________  WHEN TO CALL DR WAKEFIELD: 1. Fever over 101.0 2. Nausea and/or vomiting. 3. Extreme swelling or  bruising. 4. Continued bleeding from incision. 5. Increased pain, redness, or drainage from the incision.  The clinic   staff is available to answer your questions during regular business hours.  Please don't hesitate to call and ask to speak to one of the nurses for clinical concerns.  If you have a medical emergency, go to the nearest emergency room or call 911.  A surgeon from [facility] is always on call at the hospital.  For further questions, please visit [email or web address]     Post Anesthesia Home Care Instructions  Activity: Get plenty of rest for the remainder of the day. A responsible individual must stay with you for 24 hours following the procedure.  For the next 24 hours, DO NOT: -Drive a car -Paediatric nurse -Drink alcoholic beverages -Take any medication unless instructed by your physician -Make any legal decisions or sign important papers.  Meals: Start with liquid foods such as gelatin or soup. Progress to regular foods as tolerated. Avoid greasy, spicy, heavy foods. If nausea and/or vomiting occur, drink only clear liquids until the nausea and/or vomiting subsides. Call your physician if vomiting continues.  Special Instructions/Symptoms: Your throat may feel dry or sore from the anesthesia or the breathing tube placed in your throat during surgery. If this causes discomfort, gargle with warm salt water. The discomfort should disappear within 24 hours.  If you had a scopolamine patch placed behind your ear for the management of post- operative nausea and/or vomiting:  1. The medication in the patch is effective for 72 hours, after which it should be removed.  Wrap patch in a tissue and discard in the trash. Wash hands thoroughly with soap and water. 2. You may remove the patch earlier than 72 hours if you experience unpleasant side effects which may include dry mouth, dizziness or visual disturbances. 3. Avoid touching the patch. Wash your  hands with soap and water after contact with the patch.   No tylenol until after 5pm today.  No ibuprofen until after 5pm today.      p

## 2019-04-30 NOTE — H&P (Signed)
54 yof who had screening mm that shows right breast mass and distortion. she has no prior history. she has fh in paternal aunt with breast cancer. she has no mass or dc. MM showed 2 masses outer posterior breast. the first measures 1.2x1x1 cm by [place] and the second measures 1.1x0.7x0.5 cm in size. the two masses span 2.3 cm by ultasound. the clips are 1.2 cm apart. the axillary [place] is negative. 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 she underwent lumpectomy and sn biopsy on 3/10.  Her genetic testing was negative.   pathology was a 2.5 cm IDC, grade II that focally involves the posterior and medial margin and was close to the lateral margin. SN were negative.  The lateral margin is now completely clear after excising more.  She had oncotype that was 14.  We discussed margin excision. She has had infection that was opened by one of my partners unfortunately.   Past Surgical History  Breast Biopsy  Right. Lumpectomy/sn  Diagnostic Studies History  Colonoscopy  1-5 years ago Mammogram  within last year Pap Smear  1-5 years ago  Medication History  Medications Reconciled  Social History  Alcohol use  Occasional alcohol use. Caffeine use  Coffee. No drug use  Tobacco use  Never smoker.  Family History Alcohol Abuse  Brother, Sister. Arthritis  Mother. Depression  Brother, Sister. Heart Disease  Daughter, Son. Migraine Headache  Sister.  Pregnancy / Birth History Age at menarche  11 years. Gravida  2 Irregular periods  Length (months) of breastfeeding  7-12 Maternal age  107-30 Para  2  Other Problems  Breast Cancer  Lump In Breast  Thyroid Disease     Review of Systems General Present- Chills. Not Present- Appetite Loss, Fatigue, Fever, Night Sweats, Weight Gain and Weight Loss. Skin Not Present- Change in Wart/Mole, Dryness, Hives, Jaundice,  New Lesions, Non-Healing Wounds, Rash and Ulcer. HEENT Present- Seasonal Allergies and Wears glasses/contact lenses. Not Present- Earache, Hearing Loss, Hoarseness, Nose Bleed, Oral Ulcers, Ringing in the Ears, Sinus Pain, Sore Throat, Visual Disturbances and Yellow Eyes. Respiratory Not Present- Bloody sputum, Chronic Cough, Difficulty Breathing, Snoring and Wheezing. Breast Present- Breast Pain. Not Present- Breast Mass, Nipple Discharge and Skin Changes. Cardiovascular Present- Leg Cramps. Not Present- Chest Pain, Difficulty Breathing Lying Down, Palpitations, Rapid Heart Rate, Shortness of Breath and Swelling of Extremities. Gastrointestinal Present- Bloating and Excessive gas. Not Present- Abdominal Pain, Bloody Stool, Change in Bowel Habits, Chronic diarrhea, Constipation, Difficulty Swallowing, Gets full quickly at meals, Hemorrhoids, Indigestion, Nausea, Rectal Pain and Vomiting. Female Genitourinary Not Present- Frequency, Nocturia, Painful Urination, Pelvic Pain and Urgency. Musculoskeletal Not Present- Back Pain, Joint Pain, Joint Stiffness, Muscle Pain, Muscle Weakness and Swelling of Extremities. Neurological Not Present- Decreased Memory, Fainting, Headaches, Numbness, Seizures, Tingling, Tremor, Trouble walking and Weakness. Psychiatric Not Present- Anxiety, Bipolar, Change in Sleep Pattern, Depression, Fearful and Frequent crying. Endocrine Not Present- Cold Intolerance, Excessive Hunger, Hair Changes, Heat Intolerance, Hot flashes and New Diabetes. Hematology Not Present- Blood Thinners, Easy Bruising, Excessive bleeding, Gland problems, HIV and Persistent Infections.   Physical Exam General Mental Status-Alert. Orientation-Oriented X3. Head and Neck Trachea-midline. Thyroid Gland Characteristics - normal size and consistency. Eye Sclera/Conjunctiva - Bilateral-No scleral icterus. Breast Nipples-No Discharge. Breast Lump-No Palpable Breast Mass. Open wound im  fold, no more infection Neurologic Neurologic evaluation reveals -alert and oriented x 3 with no  impairment of recent or remote memory. Lymphatic Head & Neck General Head & Neck Lymphatics: Bilateral - Description - Normal. Axillary General Axillary Region: Bilateral - Description - Normal. Note: no [place] adenopathy   Assessment & Plan  BREAST CANCER OF UPPER-OUTER QUADRANT OF RIGHT FEMALE BREAST  Re-excision of margins

## 2019-04-30 NOTE — Op Note (Signed)
Preoperative diagnosis:  stage II right breast cancer, low oncotype, pos margins after lumpectomy Postoperative diagnosis: saa Procedure: Re-excision lumpectomy right breast Surgeon: Dr Serita Grammes Anesthesia: general with pec block EBL: 25 cc Specimens: Right breast posterior and medial margins both marked with paint Complications none Drains none Sponge and needle count correct dispo recovery stable  Indications: 61 yof who had screening mm that shows right breast mass and distortion. she has no prior history. she has fh in paternal aunt with breast cancer. she has no mass or dc. MM showed 2 masses outer posterior breast. the first measures 1.2x1x1 cm by [place] and the second measures 1.1x0.7x0.5 cm in size. the two masses span 2.3 cm by ultasound. the clips are 1.2 cm apart. the axillary [place] is negative. 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 she underwent bracketed lumpectomy with sn biopsy. Nodes negative, margins posteriorly and medially were positive. She had low oncotype.  She had infection postop. We will now proceed with margin excision.   Procedure:  After informed consent was taken to the OR  Antibiotics were given.  SCDs were in place.  She was then placed under general anesthesia.  She was prepped and draped in the standard sterile surgical fashion.  A surgical timeout was then performed.   The im fold incision was nearly healed. I reentered this scar and then used cautery and blunt dissection to release the entire cavity to identify the margins. The prior posterior margin was muscle but I did remove this in a more wide area to ensure removal and clear margins. I also excised a large medial margin for the entire cavity.    I then obtained hemostasis.  I closed the breast tissue with 2-0 Vicryl.  This was done in several layers.  I then closed the dermis with 3-0 Vicryl and the skin  with 4-0 Monocryl.  I did close the wound although certainly has small risk of reinfection. I did place 3 3-0 nylon sutures externally as well. Glue and Steri-Strips were applied. She was awakened, extubated and transferred to recovery stable

## 2019-04-30 NOTE — Anesthesia Preprocedure Evaluation (Signed)
Anesthesia Evaluation  Patient identified by MRN, date of birth, ID band Patient awake    Reviewed: Allergy & Precautions, NPO status , Patient's Chart, lab work & pertinent test results  History of Anesthesia Complications (+) PONV and history of anesthetic complications  Airway Mallampati: II  TM Distance: >3 FB Neck ROM: Full    Dental no notable dental hx. (+) Teeth Intact   Pulmonary neg pulmonary ROS,    Pulmonary exam normal breath sounds clear to auscultation       Cardiovascular negative cardio ROS Normal cardiovascular exam Rhythm:Regular Rate:Normal     Neuro/Psych negative neurological ROS  negative psych ROS   GI/Hepatic negative GI ROS, Neg liver ROS,   Endo/Other  Hyperthyroidism Obesity   Renal/GU negative Renal ROS  negative genitourinary   Musculoskeletal negative musculoskeletal ROS (+)   Abdominal (+) + obese,   Peds  Hematology negative hematology ROS (+)   Anesthesia Other Findings Right breast ca  Reproductive/Obstetrics negative OB ROS                             Anesthesia Physical Anesthesia Plan  ASA: II  Anesthesia Plan: General   Post-op Pain Management:    Induction: Intravenous  PONV Risk Score and Plan: 4 or greater and Ondansetron, Dexamethasone, Propofol infusion, Midazolam, Scopolamine patch - Pre-op, Treatment may vary due to age or medical condition, Diphenhydramine and TIVA  Airway Management Planned: LMA  Additional Equipment: None  Intra-op Plan:   Post-operative Plan: Extubation in OR  Informed Consent: I have reviewed the patients History and Physical, chart, labs and discussed the procedure including the risks, benefits and alternatives for the proposed anesthesia with the patient or authorized representative who has indicated his/her understanding and acceptance.     Dental advisory given  Plan Discussed with:  CRNA  Anesthesia Plan Comments:         Anesthesia Quick Evaluation

## 2019-04-30 NOTE — Anesthesia Procedure Notes (Signed)
Procedure Name: LMA Insertion Date/Time: 04/30/2019 11:25 AM Performed by: Gwyndolyn Saxon, CRNA Pre-anesthesia Checklist: Patient identified, Emergency Drugs available, Suction available and Patient being monitored Patient Re-evaluated:Patient Re-evaluated prior to induction Oxygen Delivery Method: Circle system utilized Preoxygenation: Pre-oxygenation with 100% oxygen Induction Type: IV induction Ventilation: Mask ventilation without difficulty LMA: LMA inserted LMA Size: 4.0 Number of attempts: 1 Placement Confirmation: positive ETCO2 and breath sounds checked- equal and bilateral Tube secured with: Tape Dental Injury: Teeth and Oropharynx as per pre-operative assessment

## 2019-04-30 NOTE — Anesthesia Postprocedure Evaluation (Signed)
Anesthesia Post Note  Patient: Cassandra Torres  Procedure(s) Performed: RE-EXCISION OF RIGHT BREAST MARGINS (Right Breast)     Patient location during evaluation: PACU Anesthesia Type: General Level of consciousness: awake and alert, oriented and patient cooperative Pain management: pain level controlled Vital Signs Assessment: post-procedure vital signs reviewed and stable Respiratory status: spontaneous breathing, nonlabored ventilation and respiratory function stable Cardiovascular status: blood pressure returned to baseline and stable Postop Assessment: no apparent nausea or vomiting Anesthetic complications: no    Last Vitals:  Vitals:   04/30/19 1230 04/30/19 1305  BP: 114/80 125/86  Pulse: 69 60  Resp: 15 16  Temp:  36.5 C  SpO2: 100% 100%    Last Pain:  Vitals:   04/30/19 1230  TempSrc:   PainSc: [place]

## 2019-04-30 NOTE — Transfer of Care (Signed)
Immediate Anesthesia Transfer of Care Note  Patient: Cassandra Torres  Procedure(s) Performed: RE-EXCISION OF RIGHT BREAST MARGINS (Right Breast)  Patient Location: PACU  Anesthesia Type:General  Level of Consciousness: awake and alert   Airway & Oxygen Therapy: Patient Spontanous Breathing and Patient connected to face mask oxygen  Post-op Assessment: Report given to RN and Post -op Vital signs reviewed and stable  Post vital signs: Reviewed and stable  Last Vitals:  Vitals Value Taken Time  BP 118/80 04/30/19 1217  Temp    Pulse 74 04/30/19 1221  Resp 15 04/30/19 1221  SpO2 100 % 04/30/19 1221  Vitals shown include unvalidated device data.  Last Pain:  Vitals:   04/30/19 1048  TempSrc: Oral  PainSc: 0-No pain      Patients Stated Pain Goal: 3 ([id number])  Complications: No apparent anesthesia complications

## 2019-04-30 NOTE — Interval H&P Note (Signed)
History and Physical Interval Note:  04/30/2019 11:13 AM  Cassandra Torres  has presented today for surgery, with the diagnosis of RIGHT BREAST CANCER.  The various methods of treatment have been discussed with the patient and family. After consideration of risks, benefits and other options for treatment, the patient has consented to  Procedure(s): RE-EXCISION OF RIGHT BREAST MARGINS (Right) as a surgical intervention.  The patient's history has been reviewed, patient examined, no change in status, stable for surgery.  I have reviewed the patient's chart and labs.  Questions were answered to the patient's satisfaction.     Rolm Bookbinder

## 2019-05-01 ENCOUNTER — Encounter: Payer: Self-pay | Admitting: *Deleted

## 2019-05-03 ENCOUNTER — Encounter: Payer: Self-pay | Admitting: *Deleted

## 2019-05-03 LAB — SURGICAL PATHOLOGY

## 2019-05-20 NOTE — Progress Notes (Signed)
Radiation Oncology         [phone] ________________________________  Name: Cassandra Torres MRN: [id number]  Date: 05/21/2019  DOB: 09/03/1965  Re-Evaluation Note  CC: Sanders, Robyn, MD  Gudena, Vinay, MD    ICD-10-CM   1. Malignant neoplasm of lower-outer quadrant of right breast of female, estrogen receptor positive (HCC)  C50.511    Z17.0     Diagnosis: Stage IA (pT2, pN0, cM0) Right Breast LOQ, Invasive Ductal Carcinoma, ER+ / PR+ / Her2-, Grade 2  Narrative: The patient returns today to discuss radiation treatment options. She was seen in the multidisciplinary breast clinic on 03/07/2019. At that time, it was recommended that the patient proceed with genetic testing, breast conserving surgery with sentinel node procedure and lumpectomy, oncotype to determine the potential benefit of adjuvant chemotherapy, adjuvant radiation therapy, and adjuvant hormonal therapy.  Since consultation, she underwent genetic testing on 03/07/2019. Results were negative.  She underwent right breast lumpectomy with deep right axillary sentinel node biopsy on 03/21/2019 performed by Dr. Wakefield. Pathology from the procedure revealed grade 2 invasive ductal carcinoma with invasive carcinoma focally involving each the posterior and medial margins. Invasive carcinoma was present <1 mm from the lateral margin. Lymphovascular invasion was identified. Four right axillary sentinel lymph nodes were biopsied and were all negative for carcinoma. Superior and lateral margins were excised and negative for carcinoma.  The patient then underwent a re-excision lumpectomy of the right breast on 04/30/2019 performed by Dr. Wakefield. Pathology from the procedure revealed grade 2 invasive ductal carcinoma that focally involved the anterior margin. Lymphovascular invasion was identified. Right medial margins showed fibrocystic changes with usual ductal hyperplasia and calcifications; no invasive carcinoma.   Oncotype DX  was obtained on the final surgical sample and the recurrence score of 14 predicts a risk of recurrence outside the breast over the next 9 years of 4%, if the patient's only systemic therapy is an antiestrogen for 5 years.  It also predicts no significant benefit from chemotherapy.  On review of systems, the patient reports a blister on her outer right breast that is causing some discomfort. She denies breast pain and any other symptoms.    Allergies:  is allergic to levaquin [levofloxacin]; penicillins; and sulfur.  Meds: Current Outpatient Medications  Medication Sig Dispense Refill  . diphenhydrAMINE (BENADRYL) 25 mg capsule Take 25 mg by mouth as needed.    . loratadine (CLARITIN) 10 MG tablet Take by mouth.    . Multiple Vitamin (MULTIVITAMINS PO) Take by mouth.    . ELDERBERRY PO Take 50 mg by mouth daily.    . oxyCODONE (OXY IR/ROXICODONE) 5 MG immediate release tablet Take 1 tablet (5 mg total) by mouth every 6 (six) hours as needed. (Patient not taking: Reported on 05/21/2019) 6 tablet 0   No current facility-administered medications for this encounter.    Physical Findings: The patient is in no acute distress. Patient is alert and oriented.  height is 5' 8.5" (1.74 m) and weight is 188 lb 12.8 oz (85.6 kg). Her temporal temperature is 97.8 F (36.6 C). Her blood pressure is 127/79 and her pulse is 95. Her respiration is 18 and oxygen saturation is 100%.  No significant changes. Lungs are clear to auscultation bilaterally. Heart has regular rate and rhythm. No palpable cervical, supraclavicular, or axillary adenopathy. Abdomen soft, non-tender, normal bowel sounds. Left breast: no palpable mass, nipple discharge or bleeding. Right breast: Well-healing scar in the lower outer quadrant of the breast along the inframammary   fold area.  No signs of drainage or infection.  Patient has swelling of the breast with possible seroma centrally estimated to be approximately 4 x 5 cm.  Sentinel node  scar healing well without signs of drainage or infection.  Small blister in the lateral aspect of the breast presumably from scratching or possibly tape irritation.  Lab Findings: Lab Results  Component Value Date   WBC 8.3 03/07/2019   HGB 13.2 03/07/2019   HCT 40.8 03/07/2019   MCV 88.5 03/07/2019   PLT 227 03/07/2019    Radiographic Findings: No results found.  Impression: Stage IA (pT2, pN0, cM0) Right Breast LOQ, Invasive Ductal Carcinoma, ER+ / PR+ / Her2-, Grade 2  Patient would be a good candidate for breast conservation with radiation therapy directed at the right breast.  As above patient had a positive margins requiring reexcision.  Reexcision showed focal positive anterior margin so we will plan on boosting this area unless Dr. Wakefield recommends additional re-excision.  Patient reports having problems with fluid accumulation in the breast requiring drainage on 3 separate occasions.  She will see Dr. Wakefield tomorrow with possible additional drainage from the breast.  Given these findings I will delay simulation for approximately 2 weeks to hopefully have this issue better.  Today I discussed the general course of treatment side effects and potential long-term toxicities of radiation therapy with the patient.  She appears to understand and wishes to proceed with planned course of treatment.   Plan:  Patient is scheduled for CT simulation approximately 2 weeks.  Patient would appear to be a good candidate for hypofractionated accelerated radiation therapy over approximately 4 weeks  -----------------------------------  James D. Kinard, PhD, MD  This document serves as a record of services personally performed by James Kinard, MD. It was created on his behalf by Maleeha Khan, a trained medical scribe. The creation of this record is based on the scribe's personal observations and the provider's statements to them. This document has been checked and approved by the attending  provider.  

## 2019-05-21 ENCOUNTER — Other Ambulatory Visit: Payer: Self-pay

## 2019-05-21 ENCOUNTER — Encounter: Payer: Self-pay | Admitting: *Deleted

## 2019-05-21 ENCOUNTER — Ambulatory Visit
Admission: RE | Admit: 2019-05-21 | Discharge: 2019-05-21 | Disposition: A | Discharge status: Home / Self Care | Payer: 59 | Source: Ambulatory Visit | Attending: Radiation Oncology | Admitting: Radiation Oncology

## 2019-05-21 ENCOUNTER — Ambulatory Visit: Admission: RE | Admit: 2019-05-21 | Payer: 59 | Source: Ambulatory Visit | Admitting: Radiation Oncology

## 2019-05-21 ENCOUNTER — Encounter: Payer: Self-pay | Admitting: Radiation Oncology

## 2019-05-21 VITALS — BP 127/79 | HR 95 | Temp 97.8°F | Resp 18 | Ht 68.5 in | Wt 188.8 lb

## 2019-05-21 DIAGNOSIS — Z170 Estrogen receptor positive status [ER+]: Secondary | ICD-10-CM

## 2019-05-21 DIAGNOSIS — Z79899 Other long term (current) drug therapy: Secondary | ICD-10-CM | POA: Insufficient documentation

## 2019-05-21 DIAGNOSIS — C50511 Malignant neoplasm of lower-outer quadrant of right female breast: Secondary | ICD-10-CM | POA: Diagnosis not present

## 2019-05-21 NOTE — Progress Notes (Addendum)
Location of Breast Cancer: Malignant neoplasm of lower outer quadrant of right breast  Did patient present with symptoms (if so, please note symptoms) or was this found on screening mammography?: Screening mammogram  Mammogram: Right breast mass and distortion.  2 masses outer posterior breast.  The first measures 1.2 x 1 x 1 cm by [place] and the second measures 1.1 x 0.7 x 0.5 cm in size.  The two masses span 2.3 cm by ultrasound.  The axillary is negative.   Histology per Pathology Report: 04/30/2019    Receptor Status: ER(95% +), PR (20% +), Her2-neu (-), Ki-67(2%)    Past/Anticipated interventions by surgeon, if any: Dr. [name] 04/30/2019 -Re-excision of right breast margins 04/30/2019 - Right Breast lumpectomy and SLN biopsy 03/21/2019 -Follow-up 05/22/2019  Past/Anticipated interventions by medical oncology, if any: Chemotherapy  Dr. Lindi Adie 03/30/2019 - Oncotype DX testing to determine if patient would benefit from chemotherapy. -Adjuvant radiation -Adjuvant antiestrogen therapy.   Lymphedema issues, if any: Fluid collection in the lower outer right breast, she has had this drained twice since surgery 04/30/2019   Pain issues, if any: No  SAFETY ISSUES:  Prior radiation? No  Pacemaker/ICD? No  Possible current pregnancy? No  Is the patient on methotrexate? No  Current Complaints / other details:   -Sporadic Menstrual Cycles- states about every 3 months.  -Sees Endocrinologist- borderline hyperthyroid  - She has a blister on her outer right breast that is causing some discomfort.   BP 127/79 (BP Location: Left Arm, Patient Position: Sitting)   Pulse 95   Temp 97.8 F (36.6 C) (Temporal)   Resp 18   Ht 5' 8.5" (1.74 m)   Wt 188 lb 12.8 oz (85.6 kg)   SpO2 100%   BMI 28.29 kg/m    Wt Readings from Last 3 Encounters:  05/21/19 188 lb 12.8 oz (85.6 kg)  04/30/19 187 lb 13.3 oz (85.2 kg)  03/30/19 197 lb 11.2 oz (89.7 kg)      Cori Razor,  RN 05/21/2019,7:14 AM

## 2019-05-28 ENCOUNTER — Other Ambulatory Visit: Payer: Self-pay | Admitting: *Deleted

## 2019-05-28 DIAGNOSIS — C50511 Malignant neoplasm of lower-outer quadrant of right female breast: Secondary | ICD-10-CM

## 2019-05-31 ENCOUNTER — Telehealth: Payer: Self-pay | Admitting: *Deleted

## 2019-05-31 NOTE — Telephone Encounter (Signed)
Received message from patient with some questions.  Left message that I was returning her call.

## 2019-06-03 NOTE — Progress Notes (Signed)
  Radiation Oncology         [phone] ________________________________  Name: Cassandra Torres MRN: [id number]  Date: 06/04/2019  DOB: 06-14-1965  SIMULATION AND TREATMENT PLANNING NOTE    ICD-10-CM   1. Malignant neoplasm of lower-outer quadrant of right breast of female, estrogen receptor positive ([place])  C50.511    Z17.0     DIAGNOSIS: Stage IA (pT2, pN0, cM0) Right Breast LOQ, Invasive Ductal Carcinoma, ER+ / PR+ / Her2-, Grade 2  NARRATIVE:  The patient was brought to the [facility].  Identity was confirmed.  All relevant records and images related to the planned course of therapy were reviewed.  The patient freely provided informed written consent to proceed with treatment after reviewing the details related to the planned course of therapy. The consent form was witnessed and verified by the simulation staff.  Then, the patient was set-up in a stable reproducible  supine position for radiation therapy.  CT images were obtained.  Surface markings were placed.  The CT images were loaded into the planning software.  Then the target and avoidance structures were contoured.  Treatment planning then occurred.  The radiation prescription was entered and confirmed.  Then, I designed and supervised the construction of a total of 5 medically necessary complex treatment devices.  I have requested : 3D Simulation  I have requested a DVH of the following structures: Lumpectomy cavity heart, lungs.  I have ordered:dose calc.  PLAN:  The patient will receive 40.05 Gy in 15 fractions followed by a boost to the lumpectomy cavity of 12 Gy in 6 fractions.   Optical Surface Tracking Plan:  Since intensity modulated radiotherapy (IMRT) and 3D conformal radiation treatment methods are predicated on accurate and precise positioning for treatment, intrafraction motion monitoring is medically necessary to ensure accurate and safe treatment delivery.  The ability to quantify intrafraction motion  without excessive ionizing radiation dose can only be performed with optical surface tracking. Accordingly, surface imaging offers the opportunity to obtain 3D measurements of patient position throughout IMRT and 3D treatments without excessive radiation exposure.  I am ordering optical surface tracking for this patient's upcoming course of radiotherapy. ________________________________    Blair Promise, PhD, MD  This document serves as a record of services personally performed by Gery Pray, MD. It was created on his behalf by Clerance Lav, a trained medical scribe. The creation of this record is based on the scribe's personal observations and the provider's statements to them. This document has been checked and approved by the attending provider.

## 2019-06-04 ENCOUNTER — Encounter: Payer: Self-pay | Admitting: *Deleted

## 2019-06-04 ENCOUNTER — Other Ambulatory Visit: Payer: Self-pay

## 2019-06-04 ENCOUNTER — Ambulatory Visit
Admission: RE | Admit: 2019-06-04 | Discharge: 2019-06-04 | Disposition: A | Discharge status: Home / Self Care | Payer: 59 | Source: Ambulatory Visit | Attending: Radiation Oncology | Admitting: Radiation Oncology

## 2019-06-04 DIAGNOSIS — Z510 Encounter for antineoplastic radiation therapy: Secondary | ICD-10-CM | POA: Insufficient documentation

## 2019-06-04 DIAGNOSIS — Z170 Estrogen receptor positive status [ER+]: Secondary | ICD-10-CM | POA: Insufficient documentation

## 2019-06-04 DIAGNOSIS — C50511 Malignant neoplasm of lower-outer quadrant of right female breast: Secondary | ICD-10-CM | POA: Diagnosis present

## 2019-06-05 ENCOUNTER — Ambulatory Visit: Payer: 59 | Attending: General Surgery

## 2019-06-05 ENCOUNTER — Telehealth: Payer: Self-pay | Admitting: Hematology and Oncology

## 2019-06-05 DIAGNOSIS — C50511 Malignant neoplasm of lower-outer quadrant of right female breast: Secondary | ICD-10-CM | POA: Diagnosis present

## 2019-06-05 DIAGNOSIS — I890 Lymphedema, not elsewhere classified: Secondary | ICD-10-CM | POA: Diagnosis present

## 2019-06-05 DIAGNOSIS — Z170 Estrogen receptor positive status [ER+]: Secondary | ICD-10-CM | POA: Diagnosis present

## 2019-06-05 NOTE — Therapy (Signed)
[place], [place], [address] Phone: [phone]   Fax:  [phone]  Physical Therapy Evaluation  Patient Details  Name: Cassandra Torres MRN: [id number] Date of Birth: 01-Apr-1965 Referring Provider (PT): Dr. Rolm Bookbinder   Encounter Date: 06/05/2019  PT End of Session - 06/05/19 0846    Visit Number  1    Number of Visits  2    Date for PT Re-Evaluation  07/03/19    PT Start Time  0804    PT Stop Time  0844    PT Time Calculation (min)  40 min    Activity Tolerance  Patient tolerated treatment well    Behavior During Therapy  [facility] for tasks assessed/performed       Past Medical History:  Diagnosis Date  . Cancer ([place]) 02/2019   right breast IDC  . Family history of brain cancer   . Family history of breast cancer   . Family history of prostate cancer   . PONV (postoperative nausea and vomiting)   . Seasonal allergies   . Thyrotoxicosis     Past Surgical History:  Procedure Laterality Date  . BREAST LUMPECTOMY WITH RADIOACTIVE SEED AND SENTINEL LYMPH NODE BIOPSY Right 03/21/2019   Procedure: RIGHT BREAST LUMPECTOMY WITH BRACKETED RADIOACTIVE SEED AND SENTINEL LYMPH NODE BIOPSY;  Surgeon: Rolm Bookbinder, MD;  Location: [facility];  Service: General;  Laterality: Right;  . COLONOSCOPY    . COLPOSCOPY    . RE-EXCISION OF BREAST CANCER,SUPERIOR MARGINS Right 04/30/2019   Procedure: RE-EXCISION OF RIGHT BREAST MARGINS;  Surgeon: Rolm Bookbinder, MD;  Location: [facility];  Service: General;  Laterality: Right;    There were no vitals filed for this visit.   Subjective Assessment - 06/05/19 0810    Subjective  Pt states that she has an incision under her R breast that got infected after the first surgery. Pt states that she has been to [facility] class and has had her re-excision on 04/30/2019. She went back to the MD and was told that she no longer has any fluid to  aspirate from her seroma.    Pertinent History  Patient was diagnosed on 02/01/2019 with right grade III invasive ductal carcinoma breast cancer. It is ER/PR positive and HER2 negative with a Ki67 of 2%. Patient underwent a right lumpectomy and sentinel node biopsy (4 negative nodes removed) on 03/21/2019. RE-excision on 04/30/19    Patient Stated Goals  I want to make sure what type of bra I need or if I even need a compression bra.    Currently in Pain?  No/denies    Pain Score  0-No pain         [facility] PT Assessment - 06/05/19 0001      Assessment   Medical Diagnosis  s/p right lumpectomy and SLNB    Referring Provider (PT)  Dr. Rolm Bookbinder    Onset Date/Surgical Date  04/30/19    Hand Dominance  Left    Prior Therapy  Baselines      Precautions   Precautions  Other (comment)    Precaution Comments  cancer/radiation, risk lymphedema      Restrictions   Weight Bearing Restrictions  No      Balance Screen   Has the patient fallen in the past 6 months  No    Has the patient had a decrease in activity level because of a fear of falling?   No  Is the patient reluctant to leave their home because of a fear of falling?   No      Home Environment   Living Environment  Private residence    Living Arrangements  Spouse/significant other;Children   Husband and 57 y.o. son   Available Help at Discharge  Family      Prior Function   Level of Independence  Independent    Vocation  Full time employment    Vocation Requirements  Currently working from home      Cognition   Overall Cognitive Status  Within Functional Limits for tasks assessed      Observation/Other Assessments   Observations  inferior breast incision looks mostly healed.       Posture/Postural Control   Posture/Postural Control  Postural limitations    Postural Limitations  Rounded Shoulders;Forward head      ROM / Strength   AROM / PROM / Strength  --        LYMPHEDEMA/ONCOLOGY QUESTIONNAIRE - 06/05/19  0821      Type   Cancer Type  Right breast cancer      Surgeries   Lumpectomy Date  03/21/19    Sentinel Lymph Node Biopsy Date  03/21/19    Other Surgery Date  04/30/19    Number Lymph Nodes Removed  4      Treatment   Active Chemotherapy Treatment  No    Past Chemotherapy Treatment  No    Active Radiation Treatment  No   06/13/2019   Past Radiation Treatment  No    Current Hormone Treatment  No    Past Hormone Therapy  No      What other symptoms do you have   Are you Having Heaviness or Tightness  No    Are you having Pain  No    Are you having pitting edema  No    Is it Hard or Difficult finding clothes that fit  No    Do you have infections  Yes    Comments  previously in breast incision    Is there Decreased scar mobility  Yes    Stemmer Sign  No      Right Upper Extremity Lymphedema   Other  98 cm at the axillary line at the end of exhalation in sitting.     Other  108 cm at the nipple line at the end of exhalation in sitting.     Other  91 cm under the breast at the end of exhalation in sitting.                Objective measurements completed on examination: See above findings.              PT Education - 06/05/19 0845    Education Details  Pt was measured for a compression bra and provided with information for purchasing. Discussed the correct fit of a compression bra and how the heat/friction from a compession bra helps break up scar tissue as well as decrease fluid in the area. DIscussed skin changes that are possible with radiation and how this would effect wearing a compression bra. Pt was educated on lymphedema vs. post op edema and the use of manual lymph drainage with both types of edema.    Person(s) Educated  Patient    Methods  Explanation;Handout    Comprehension  Verbalized understanding          PT Long Term Goals - 06/05/19 [phone]  PT LONG TERM GOAL #1   Title  Pt will be independent with manual lymph drainage of the R  breast in order to demonstrate autonomy of care in managing edema.    Baseline  Currently pt does not know how to perform MLD    Time  4    Period  Weeks    Status  New    Target Date  07/03/19      PT LONG TERM GOAL #2   Title  Pt will have a compression bra that fits properly to wear on a daily basis to manage edema at home.    Baseline  Pt currently does not have a compression bra.    Time  4    Period  Weeks    Status  New    Target Date  07/03/19             Plan - 06/05/19 0847    Clinical Impression Statement  Pt presents to physicla therapy post re-excision of the R breast due to scar tissue and edema that is present. She did have a seroma that she was concerned about but this has since resolved. She currently demonstrates edema in the R breast with dense scar tissue in the inferior to lateral aspects of the R breast. She has attended the after breast cancer class and this session was educated on compression bras and assisted with measurements and recommendations for the correct compression bra. Pt will benefit from learning manual lymph drainage. She will benefit from 1x more visit for education on manual lymph drainage of the R breast and to make sure that she has a correct fit of the compression bra.    Personal Factors and Comorbidities  Comorbidity 1    Comorbidities  Pt is starting radiation 06/13/19 and has had a re-exicsion of her R breast on 04/30/19    Stability/Clinical Decision Making  Stable/Uncomplicated    Clinical Decision Making  Low    Rehab Potential  Excellent    PT Frequency  Monthy    PT Duration  4 weeks    PT Treatment/Interventions  Therapeutic activities;Therapeutic exercise;Neuromuscular re-education;Manual techniques    PT Next Visit Plan  Teach MLD and assess compression bra. D/C    PT Home Exercise Plan  Continue with Post op shoulder ROM HEP    Recommended Other Services  Pt provided with script and measured for a [employer] size L     Consulted and Agree with Plan of Care  Patient       Patient will benefit from skilled therapeutic intervention in order to improve the following deficits and impairments:  Increased edema, Decreased knowledge of precautions  Visit Diagnosis: Malignant neoplasm of lower-outer quadrant of right breast of female, estrogen receptor positive ([place]) - Plan: PT plan of care cert/re-cert  Lymphedema, not elsewhere classified - Plan: PT plan of care cert/re-cert     Problem List Patient Active Problem List   Diagnosis Date Noted  . Genetic testing 03/19/2019  . Family history of breast cancer   . Family history of prostate cancer   . Family history of brain cancer   . Malignant neoplasm of lower-outer quadrant of right breast of female, estrogen receptor positive ([place]) 03/02/2019    Ander Purpura, PT 06/05/2019, 8:53 AM  [place], [place], [address] Phone: [phone]   Fax:  [phone]  Name: Cassandra Torres MRN: [id number] Date of Birth: 10/13/1965

## 2019-06-05 NOTE — Telephone Encounter (Signed)
Scheduled appt per 5/24 sch message - mailed reminder letter with appt date and time

## 2019-06-06 ENCOUNTER — Telehealth: Payer: Self-pay | Admitting: *Deleted

## 2019-06-06 DIAGNOSIS — Z510 Encounter for antineoplastic radiation therapy: Secondary | ICD-10-CM | POA: Diagnosis not present

## 2019-06-06 NOTE — Telephone Encounter (Signed)
Received message from patient asking if we have received her FMLA papers that she had dropped off.  Called patient and left voicemail that I had sent a message to the appropriate department asking if they have received her paperwork.

## 2019-06-13 ENCOUNTER — Ambulatory Visit
Admission: RE | Admit: 2019-06-13 | Discharge: 2019-06-13 | Disposition: A | Discharge status: Home / Self Care | Payer: 59 | Source: Ambulatory Visit | Attending: Radiation Oncology | Admitting: Radiation Oncology

## 2019-06-13 ENCOUNTER — Other Ambulatory Visit: Payer: Self-pay

## 2019-06-13 DIAGNOSIS — R293 Abnormal posture: Secondary | ICD-10-CM | POA: Diagnosis present

## 2019-06-13 DIAGNOSIS — C50511 Malignant neoplasm of lower-outer quadrant of right female breast: Secondary | ICD-10-CM

## 2019-06-13 DIAGNOSIS — Z170 Estrogen receptor positive status [ER+]: Secondary | ICD-10-CM | POA: Diagnosis present

## 2019-06-13 DIAGNOSIS — Z483 Aftercare following surgery for neoplasm: Secondary | ICD-10-CM | POA: Diagnosis present

## 2019-06-13 DIAGNOSIS — I890 Lymphedema, not elsewhere classified: Secondary | ICD-10-CM | POA: Diagnosis present

## 2019-06-13 NOTE — Progress Notes (Signed)
  Radiation Oncology         [phone] ________________________________  Name: Cassandra Torres MRN: [id number]  Date: 06/13/2019  DOB: Feb 04, 1965  Simulation Verification Note    ICD-10-CM   1. Malignant neoplasm of lower-outer quadrant of right breast of female, estrogen receptor positive ([place])  C50.511    Z17.0     NARRATIVE: The patient was brought to the treatment unit and placed in the planned treatment position. The clinical setup was verified. Then port films were obtained and uploaded to the radiation oncology medical record software.  The treatment beams were carefully compared against the planned radiation fields. The position location and shape of the radiation fields was reviewed. They targeted volume of tissue appears to be appropriately covered by the radiation beams. Organs at risk appear to be excluded as planned.  Based on my personal review, I approved the simulation verification. The patient's treatment will proceed as planned.  -----------------------------------  Blair Promise, PhD, MD  This document serves as a record of services personally performed by Gery Pray, MD. It was created on his behalf by Clerance Lav, a trained medical scribe. The creation of this record is based on the scribe's personal observations and the provider's statements to them. This document has been checked and approved by the attending provider.

## 2019-06-14 ENCOUNTER — Ambulatory Visit
Admission: RE | Admit: 2019-06-14 | Discharge: 2019-06-14 | Disposition: A | Discharge status: Home / Self Care | Payer: 59 | Source: Ambulatory Visit | Attending: Radiation Oncology | Admitting: Radiation Oncology

## 2019-06-14 ENCOUNTER — Other Ambulatory Visit: Payer: Self-pay

## 2019-06-14 DIAGNOSIS — C50511 Malignant neoplasm of lower-outer quadrant of right female breast: Secondary | ICD-10-CM

## 2019-06-14 DIAGNOSIS — Z170 Estrogen receptor positive status [ER+]: Secondary | ICD-10-CM

## 2019-06-14 MED ORDER — ALRA NON-METALLIC DEODORANT (RAD-ONC)
1.0000 "application " | Freq: Once | TOPICAL | Status: AC
  Administered 2019-06-14: 1 via TOPICAL

## 2019-06-14 MED ORDER — SONAFINE EX EMUL
1.0000 "application " | Freq: Two times a day (BID) | CUTANEOUS | Status: DC
  Administered 2019-06-14: 1 via TOPICAL

## 2019-06-14 NOTE — Progress Notes (Signed)
Patient came to clinic after her radiation tx at 1620. Patient given Radiation handbook and side effects of tx reviewed. Alra deodorant and Soafine cream provided and pt. Educated re: their use. Pt. Verbalized understanding.

## 2019-06-15 ENCOUNTER — Ambulatory Visit
Admission: RE | Admit: 2019-06-15 | Discharge: 2019-06-15 | Disposition: A | Discharge status: Home / Self Care | Payer: 59 | Source: Ambulatory Visit | Attending: Radiation Oncology | Admitting: Radiation Oncology

## 2019-06-15 ENCOUNTER — Other Ambulatory Visit: Payer: Self-pay

## 2019-06-15 DIAGNOSIS — C50511 Malignant neoplasm of lower-outer quadrant of right female breast: Secondary | ICD-10-CM | POA: Diagnosis not present

## 2019-06-18 ENCOUNTER — Other Ambulatory Visit: Payer: Self-pay

## 2019-06-18 ENCOUNTER — Ambulatory Visit
Admission: RE | Admit: 2019-06-18 | Discharge: 2019-06-18 | Disposition: A | Discharge status: Home / Self Care | Payer: 59 | Source: Ambulatory Visit | Attending: Radiation Oncology | Admitting: Radiation Oncology

## 2019-06-18 DIAGNOSIS — C50511 Malignant neoplasm of lower-outer quadrant of right female breast: Secondary | ICD-10-CM | POA: Diagnosis not present

## 2019-06-19 ENCOUNTER — Ambulatory Visit
Admission: RE | Admit: 2019-06-19 | Discharge: 2019-06-19 | Disposition: A | Discharge status: Home / Self Care | Payer: 59 | Source: Ambulatory Visit | Attending: Radiation Oncology | Admitting: Radiation Oncology

## 2019-06-19 ENCOUNTER — Other Ambulatory Visit: Payer: Self-pay

## 2019-06-19 DIAGNOSIS — C50511 Malignant neoplasm of lower-outer quadrant of right female breast: Secondary | ICD-10-CM | POA: Diagnosis not present

## 2019-06-20 ENCOUNTER — Ambulatory Visit
Admission: RE | Admit: 2019-06-20 | Discharge: 2019-06-20 | Disposition: A | Discharge status: Home / Self Care | Payer: 59 | Source: Ambulatory Visit | Attending: Radiation Oncology | Admitting: Radiation Oncology

## 2019-06-20 ENCOUNTER — Other Ambulatory Visit: Payer: Self-pay

## 2019-06-20 DIAGNOSIS — C50511 Malignant neoplasm of lower-outer quadrant of right female breast: Secondary | ICD-10-CM | POA: Diagnosis not present

## 2019-06-21 ENCOUNTER — Ambulatory Visit
Admission: RE | Admit: 2019-06-21 | Discharge: 2019-06-21 | Disposition: A | Discharge status: Home / Self Care | Payer: 59 | Source: Ambulatory Visit | Attending: Radiation Oncology | Admitting: Radiation Oncology

## 2019-06-21 ENCOUNTER — Other Ambulatory Visit: Payer: Self-pay

## 2019-06-21 ENCOUNTER — Ambulatory Visit: Payer: 59 | Attending: General Surgery

## 2019-06-21 DIAGNOSIS — R293 Abnormal posture: Secondary | ICD-10-CM | POA: Insufficient documentation

## 2019-06-21 DIAGNOSIS — C50511 Malignant neoplasm of lower-outer quadrant of right female breast: Secondary | ICD-10-CM | POA: Diagnosis not present

## 2019-06-21 DIAGNOSIS — I890 Lymphedema, not elsewhere classified: Secondary | ICD-10-CM

## 2019-06-21 DIAGNOSIS — Z483 Aftercare following surgery for neoplasm: Secondary | ICD-10-CM | POA: Insufficient documentation

## 2019-06-21 DIAGNOSIS — Z170 Estrogen receptor positive status [ER+]: Secondary | ICD-10-CM | POA: Insufficient documentation

## 2019-06-21 NOTE — Therapy (Signed)
[place], [place], [address] Phone: [phone]   Fax:  [phone]  Physical Therapy Treatment  Patient Details  Name: Cassandra Torres MRN: [id number] Date of Birth: [birth year] Referring Provider (PT): Dr. Rolm Bookbinder   Encounter Date: 06/21/2019   PT End of Session - 06/21/19 0907    Visit Number 2    Number of Visits 2    Date for PT Re-Evaluation 07/03/19    PT Start Time 0805    PT Stop Time 0906    PT Time Calculation (min) 61 min    Activity Tolerance Patient tolerated treatment well    Behavior During Therapy Cassandra Torres for tasks assessed/performed           Past Medical History:  Diagnosis Date   Cancer ([place]) 02/2019   right breast IDC   Family history of brain cancer    Family history of breast cancer    Family history of prostate cancer    PONV (postoperative nausea and vomiting)    Seasonal allergies    Thyrotoxicosis     Past Surgical History:  Procedure Laterality Date   BREAST LUMPECTOMY WITH RADIOACTIVE SEED AND SENTINEL LYMPH NODE BIOPSY Right 03/21/2019   Procedure: RIGHT BREAST LUMPECTOMY WITH BRACKETED RADIOACTIVE SEED AND SENTINEL LYMPH NODE BIOPSY;  Surgeon: Rolm Bookbinder, MD;  Location: [facility];  Service: General;  Laterality: Right;   COLONOSCOPY     COLPOSCOPY     RE-EXCISION OF BREAST CANCER,SUPERIOR MARGINS Right 04/30/2019   Procedure: RE-EXCISION OF RIGHT BREAST MARGINS;  Surgeon: Rolm Bookbinder, MD;  Location: [facility];  Service: General;  Laterality: Right;    There were no vitals filed for this visit.                      [place] Adult PT Treatment/Exercise - 06/21/19 0001      Manual Therapy   Manual Therapy Manual Lymphatic Drainage (MLD)    Manual therapy comments Assessed new compression bras and these appear to be good fit.     Manual Lymphatic Drainage (MLD) In Supine instructing  pt while therapist initially performing then having her return demo of all with VCs and hand over hand pressure for pressure check and to assure skin stretch only, no sliding: Short neck, 5 diaphragmatic breaths with instruction for proper deep breathing, Lt axilla and Rt inguinal nodes, anterior inter-axillary and Rt axillo-inguinal anastomosis, then focused on Rt breast, superior aspect towards inter-axillary anastomosis, then into Lt S/L for lateral and inferior breast towards Rt axillo-inguinal anastomosis, then finished retracing steps in supine.                        PT Long Term Goals - 06/21/19 0913      PT LONG TERM GOAL #1   Title Pt will be independent with manual lymph drainage of the R breast in order to demonstrate autonomy of care in managing edema.    Baseline Currently pt does not know how to perform MLD; pt instructed in this today-06/21/19    Status Achieved      PT LONG TERM GOAL #2   Title Pt will have a compression bra that fits properly to wear on a daily basis to manage edema at home.    Baseline Pt currently does not have a compression bra; pt now has one and has ordered 2 more-06/21/19    Status Achieved  Plan - 06/21/19 0907    Clinical Impression Statement Focused today on instructing pt in self manual lymph drainage for Rt breast. She was able to return fairly good demo with light pressure and good skin stretch after hand over hand pressure. Pt has received new compression bras that appear to be a good fit. She would like to return after radiation ends for an assess of her breast lymphedema and for her husband to be instructed in MLD as well.    Personal Factors and Comorbidities Comorbidity 1    Comorbidities Pt is starting radiation 06/13/19 and has had a re-exicsion of her R breast on 04/30/19    Stability/Clinical Decision Making Stable/Uncomplicated    Rehab Potential Excellent    PT Frequency Monthy    PT Duration 4 weeks     PT Treatment/Interventions Therapeutic activities;Therapeutic exercise;Neuromuscular re-education;Manual techniques    PT Next Visit Plan Renewal when pt returns after radiation and determine if she needs further visit. Instruct husband in MLD and assess pts technique with same.    PT Home Exercise Plan Continue with Post op shoulder ROM HEP    Consulted and Agree with Plan of Care Patient           Patient will benefit from skilled therapeutic intervention in order to improve the following deficits and impairments:  Increased edema, Decreased knowledge of precautions  Visit Diagnosis: Malignant neoplasm of lower-outer quadrant of right breast of female, estrogen receptor positive ([place])  Lymphedema, not elsewhere classified  Abnormal posture  Aftercare following surgery for neoplasm     Problem List Patient Active Problem List   Diagnosis Date Noted   Genetic testing 03/19/2019   Family history of breast cancer    Family history of prostate cancer    Family history of brain cancer    Malignant neoplasm of lower-outer quadrant of right breast of female, estrogen receptor positive ([place]) 03/02/2019    Cassandra Torres, PTA 06/21/2019, 10:48 AM  [place], [place], [address] Phone: [phone]   Fax:  [phone]  Name: Cassandra Torres MRN: [id number] Date of Birth: May 08, 1965

## 2019-06-21 NOTE — Patient Instructions (Signed)
Self manual lymph drainage:     Cancer Rehab [phone] Perform this sequence once a day.  Only give enough pressure to make the skin move.  Hug yourself.  Do circles at your neck just above your collarbones.  Repeat this 10 times.  Diaphragmatic - Supine   Inhale through nose making navel move out toward hands. Exhale through puckered lips, hands follow navel in. Repeat _5__ times. Rest _10__ seconds between repeats.   Axilla - One at a Time   Using full weight of flat hand and fingers at center of uninvolved armpit, make _10__ in-place circles.   LEG: Inguinal Nodes Stimulation   With small finger side of hand against hip crease on involved side, gently perform circles at the crease. Repeat __10_ times.   1) Axilla to Inguinal Nodes - Sweep   On involved side, sweep _4__ times from armpit along side of trunk to hip crease.  Now gently stretch skin from the involved side to the uninvolved side across the chest at the shoulder line.  Repeat that 4 times.  Draw an imaginary diagonal line from upper outer breast through the nipple area toward lower inner breast.  Direct fluid upward and inward from this line toward the pathway across your upper chest .  Do this in three rows to treat all of the upper inner breast tissue, and do each row 3-4x.      Direct fluid to treat all of lower outer breast tissue downward and outward toward pathway that is aimed at the left groin.  Finish by doing the pathways as described above going from your involved armpit to the same side groin and going across your upper chest from the involved shoulder to the uninvolved shoulder.  Repeat the steps above where you do circles in your right groin and left armpit.

## 2019-06-22 ENCOUNTER — Ambulatory Visit
Admission: RE | Admit: 2019-06-22 | Discharge: 2019-06-22 | Disposition: A | Discharge status: Home / Self Care | Payer: 59 | Source: Ambulatory Visit | Attending: Radiation Oncology | Admitting: Radiation Oncology

## 2019-06-22 ENCOUNTER — Other Ambulatory Visit: Payer: Self-pay

## 2019-06-22 DIAGNOSIS — C50511 Malignant neoplasm of lower-outer quadrant of right female breast: Secondary | ICD-10-CM | POA: Diagnosis not present

## 2019-06-25 ENCOUNTER — Other Ambulatory Visit: Payer: Self-pay

## 2019-06-25 ENCOUNTER — Ambulatory Visit
Admission: RE | Admit: 2019-06-25 | Discharge: 2019-06-25 | Disposition: A | Discharge status: Home / Self Care | Payer: 59 | Source: Ambulatory Visit | Attending: Radiation Oncology | Admitting: Radiation Oncology

## 2019-06-25 DIAGNOSIS — C50511 Malignant neoplasm of lower-outer quadrant of right female breast: Secondary | ICD-10-CM | POA: Diagnosis not present

## 2019-06-26 ENCOUNTER — Ambulatory Visit
Admission: RE | Admit: 2019-06-26 | Discharge: 2019-06-26 | Disposition: A | Discharge status: Home / Self Care | Payer: 59 | Source: Ambulatory Visit | Attending: Radiation Oncology | Admitting: Radiation Oncology

## 2019-06-26 DIAGNOSIS — C50511 Malignant neoplasm of lower-outer quadrant of right female breast: Secondary | ICD-10-CM | POA: Diagnosis not present

## 2019-06-27 ENCOUNTER — Other Ambulatory Visit: Payer: Self-pay

## 2019-06-27 ENCOUNTER — Encounter: Payer: Self-pay | Admitting: *Deleted

## 2019-06-27 ENCOUNTER — Ambulatory Visit
Admission: RE | Admit: 2019-06-27 | Discharge: 2019-06-27 | Disposition: A | Discharge status: Home / Self Care | Payer: 59 | Source: Ambulatory Visit | Attending: Radiation Oncology | Admitting: Radiation Oncology

## 2019-06-27 DIAGNOSIS — C50511 Malignant neoplasm of lower-outer quadrant of right female breast: Secondary | ICD-10-CM | POA: Diagnosis not present

## 2019-06-28 ENCOUNTER — Other Ambulatory Visit: Payer: Self-pay

## 2019-06-28 ENCOUNTER — Ambulatory Visit
Admission: RE | Admit: 2019-06-28 | Discharge: 2019-06-28 | Disposition: A | Discharge status: Home / Self Care | Payer: 59 | Source: Ambulatory Visit | Attending: Radiation Oncology | Admitting: Radiation Oncology

## 2019-06-28 DIAGNOSIS — C50511 Malignant neoplasm of lower-outer quadrant of right female breast: Secondary | ICD-10-CM | POA: Diagnosis not present

## 2019-06-28 DIAGNOSIS — Z170 Estrogen receptor positive status [ER+]: Secondary | ICD-10-CM

## 2019-06-28 MED ORDER — SONAFINE EX EMUL
1.0000 "application " | Freq: Two times a day (BID) | CUTANEOUS | Status: DC
  Administered 2019-06-28: 1 via TOPICAL

## 2019-06-28 NOTE — Progress Notes (Signed)
Sonafine 1 tube provided to patient in Radiation per her request.

## 2019-06-29 ENCOUNTER — Telehealth: Payer: Self-pay | Admitting: Hematology and Oncology

## 2019-06-29 ENCOUNTER — Other Ambulatory Visit: Payer: Self-pay

## 2019-06-29 ENCOUNTER — Ambulatory Visit
Admission: RE | Admit: 2019-06-29 | Discharge: 2019-06-29 | Disposition: A | Discharge status: Home / Self Care | Payer: 59 | Source: Ambulatory Visit | Attending: Radiation Oncology | Admitting: Radiation Oncology

## 2019-06-29 DIAGNOSIS — C50511 Malignant neoplasm of lower-outer quadrant of right female breast: Secondary | ICD-10-CM | POA: Diagnosis not present

## 2019-06-29 NOTE — Telephone Encounter (Signed)
R/s appt per 6/17 sch message - pt is aware of appt date and time

## 2019-07-01 DIAGNOSIS — C50511 Malignant neoplasm of lower-outer quadrant of right female breast: Secondary | ICD-10-CM | POA: Diagnosis not present

## 2019-07-02 ENCOUNTER — Ambulatory Visit
Admission: RE | Admit: 2019-07-02 | Discharge: 2019-07-02 | Disposition: A | Discharge status: Home / Self Care | Payer: 59 | Source: Ambulatory Visit | Attending: Radiation Oncology | Admitting: Radiation Oncology

## 2019-07-02 ENCOUNTER — Other Ambulatory Visit: Payer: Self-pay

## 2019-07-02 DIAGNOSIS — C50511 Malignant neoplasm of lower-outer quadrant of right female breast: Secondary | ICD-10-CM | POA: Diagnosis not present

## 2019-07-03 ENCOUNTER — Other Ambulatory Visit: Payer: Self-pay

## 2019-07-03 ENCOUNTER — Ambulatory Visit: Payer: 59 | Admitting: Radiation Oncology

## 2019-07-03 ENCOUNTER — Ambulatory Visit
Admission: RE | Admit: 2019-07-03 | Discharge: 2019-07-03 | Disposition: A | Discharge status: Home / Self Care | Payer: 59 | Source: Ambulatory Visit | Attending: Radiation Oncology | Admitting: Radiation Oncology

## 2019-07-03 DIAGNOSIS — C50511 Malignant neoplasm of lower-outer quadrant of right female breast: Secondary | ICD-10-CM | POA: Diagnosis not present

## 2019-07-04 ENCOUNTER — Ambulatory Visit
Admission: RE | Admit: 2019-07-04 | Discharge: 2019-07-04 | Disposition: A | Discharge status: Home / Self Care | Payer: 59 | Source: Ambulatory Visit | Attending: Radiation Oncology | Admitting: Radiation Oncology

## 2019-07-04 DIAGNOSIS — C50511 Malignant neoplasm of lower-outer quadrant of right female breast: Secondary | ICD-10-CM | POA: Diagnosis not present

## 2019-07-05 ENCOUNTER — Other Ambulatory Visit: Payer: Self-pay

## 2019-07-05 ENCOUNTER — Ambulatory Visit
Admission: RE | Admit: 2019-07-05 | Discharge: 2019-07-05 | Disposition: A | Discharge status: Home / Self Care | Payer: 59 | Source: Ambulatory Visit | Attending: Radiation Oncology | Admitting: Radiation Oncology

## 2019-07-05 ENCOUNTER — Inpatient Hospital Stay: Payer: 59 | Admitting: Hematology and Oncology

## 2019-07-05 DIAGNOSIS — C50511 Malignant neoplasm of lower-outer quadrant of right female breast: Secondary | ICD-10-CM | POA: Diagnosis not present

## 2019-07-06 ENCOUNTER — Other Ambulatory Visit: Payer: Self-pay

## 2019-07-06 ENCOUNTER — Ambulatory Visit
Admission: RE | Admit: 2019-07-06 | Discharge: 2019-07-06 | Disposition: A | Discharge status: Home / Self Care | Payer: 59 | Source: Ambulatory Visit | Attending: Radiation Oncology | Admitting: Radiation Oncology

## 2019-07-06 DIAGNOSIS — C50511 Malignant neoplasm of lower-outer quadrant of right female breast: Secondary | ICD-10-CM | POA: Diagnosis not present

## 2019-07-09 ENCOUNTER — Ambulatory Visit
Admission: RE | Admit: 2019-07-09 | Discharge: 2019-07-09 | Disposition: A | Discharge status: Home / Self Care | Payer: 59 | Source: Ambulatory Visit | Attending: Radiation Oncology | Admitting: Radiation Oncology

## 2019-07-09 DIAGNOSIS — C50511 Malignant neoplasm of lower-outer quadrant of right female breast: Secondary | ICD-10-CM | POA: Diagnosis not present

## 2019-07-10 ENCOUNTER — Ambulatory Visit
Admission: RE | Admit: 2019-07-10 | Discharge: 2019-07-10 | Disposition: A | Discharge status: Home / Self Care | Payer: 59 | Source: Ambulatory Visit | Attending: Radiation Oncology | Admitting: Radiation Oncology

## 2019-07-10 ENCOUNTER — Other Ambulatory Visit: Payer: Self-pay

## 2019-07-10 ENCOUNTER — Encounter: Payer: Self-pay | Admitting: *Deleted

## 2019-07-10 DIAGNOSIS — C50511 Malignant neoplasm of lower-outer quadrant of right female breast: Secondary | ICD-10-CM | POA: Diagnosis not present

## 2019-07-11 ENCOUNTER — Other Ambulatory Visit: Payer: Self-pay

## 2019-07-11 ENCOUNTER — Encounter: Payer: Self-pay | Admitting: Radiation Oncology

## 2019-07-11 ENCOUNTER — Ambulatory Visit
Admission: RE | Admit: 2019-07-11 | Discharge: 2019-07-11 | Disposition: A | Discharge status: Home / Self Care | Payer: 59 | Source: Ambulatory Visit | Attending: Radiation Oncology | Admitting: Radiation Oncology

## 2019-07-11 DIAGNOSIS — C50511 Malignant neoplasm of lower-outer quadrant of right female breast: Secondary | ICD-10-CM | POA: Diagnosis not present

## 2019-07-17 ENCOUNTER — Ambulatory Visit: Payer: 59 | Admitting: Hematology and Oncology

## 2019-07-22 NOTE — Progress Notes (Signed)
Patient Care Team: Glendale Chard, MD as PCP - General (Internal Medicine) Mauro Kaufmann, RN as Oncology Nurse Navigator Rockwell Germany, RN as Oncology Nurse Navigator Rolm Bookbinder, MD as Consulting Physician (General Surgery) Nicholas Lose, MD as Consulting Physician (Hematology and Oncology) Gery Pray, MD as Consulting Physician (Radiation Oncology)  DIAGNOSIS:    ICD-10-CM   1. Malignant neoplasm of lower-outer quadrant of right breast of female, estrogen receptor positive ([place])  C50.511    Z17.0     SUMMARY OF ONCOLOGIC HISTORY: Oncology History  Malignant neoplasm of lower-outer quadrant of right breast of female, estrogen receptor positive ([place])  03/02/2019 Initial Diagnosis   Screening mammogram showed right breast masses. Diagnostic mammogram and US showed a 1.2cm mass at the 7 o'clock position 7cm from the nipple, and a 1.1cm mass at the 7 o'clock position 6cm from the nipple, spanning 2.3cm in total, with no evidence of right axillary adenopathy. Biopsy showed IDC, grade 3, HER-2 equivocal by IHC, negative by FISH, ER+ 90%, PR+ 20%, Ki67 2%.     Genetic Testing   Negative genetic testing. No pathogenic variants identified on the [facility] Breast Cancer STAT Panel + Common Hereditary Cancers Panel + Brain Cancer Panel. VUS in KIT called [id number] identified. The report date is 03/17/2019.  The Breast Cancer STAT Panel + Common Hereditary Cancers Panel + Brain Cancer Panel offered by [facility] includes sequencing and/or deletion duplication testing of the following 70 genes: AIP, ALK, APC*, ATM*, AXIN2, BAP1, BARD1, BMPR1A, BRCA1, BRCA2, BRIP1, CDH1, CDK4, CDKN2A (p14ARF), CDKN2A (p16INK4a), CHEK2, CTNNA1, DICER1*, EPCAM*, EZH2*, GPC3*, GREM1*, HOXB13, HRAS, KIF1B*, KIT, LZTR1, MAX*, MEN1*, MLH1*, MSH2*, MSH3*, MSH6*, MUTYH, NBN, NF1*, NF2, NTHL1, PALB2, PDGFRA, PHOX2B*, PMS2*, POLD1*, POLE, POT1, PRKAR1A, PTCH1, PTCH2, PTEN*, RAD50, RAD51C, RAD51D, RB1*, RET, RNF43, SDHA*,  SDHAF2, SDHB, SDHC*, SDHD, SMAD4, SMARCA4, SMARCB1, SMARCE1, STK11, SUFU, TMEM127, TP53, TSC1*, TSC2, VHL.   03/21/2019 Surgery   Right lumpectomy [name]): IDC, 2.5cm, grade 2, 4 right axillary lymph nodes negative for carcinoma.   03/30/2019 Cancer Staging   Staging form: Breast, AJCC 8th Edition - Pathologic stage from 03/30/2019: Stage IA (pT2, pN0, cM0, G2, ER+, PR+, HER2-) - Signed by Nicholas Lose, MD on 03/30/2019   03/30/2019 Oncotype testing   14/4%   04/30/2019 Surgery   Re-excision of positive margin [name]): remaining invasive ductal carcinoma, 0.6cm, grade 2.   06/14/2019 - 07/11/2019 Radiation Therapy   Adjuvant radiation     CHIEF COMPLIANT: Follow-up to discuss antiestrogen therapy  INTERVAL HISTORY: Cassandra Torres is a 54 y.o. with above-mentioned history of right breast cancer who underwent a right lumpectomy followed by re-excision of a positive margin, and is completed radiation on 07/11/19. She presents to the clinic today to discuss antiestrogen therapy.   ALLERGIES:  is allergic to levaquin [levofloxacin], penicillins, and sulfur.  MEDICATIONS:  Current Outpatient Medications  Medication Sig Dispense Refill  . diphenhydrAMINE (BENADRYL) 25 mg capsule Take 25 mg by mouth as needed.    Marland Kitchen ELDERBERRY PO Take 50 mg by mouth daily.    Marland Kitchen loratadine (CLARITIN) 10 MG tablet Take by mouth.    . Multiple Vitamin (MULTIVITAMINS PO) Take by mouth.    . oxyCODONE (OXY IR/ROXICODONE) 5 MG immediate release tablet Take 1 tablet (5 mg total) by mouth every 6 (six) hours as needed. (Patient not taking: Reported on 05/21/2019) 6 tablet 0   No current facility-administered medications for this visit.    PHYSICAL EXAMINATION: ECOG PERFORMANCE STATUS: 1 - Symptomatic but  completely ambulatory  Vitals:   07/23/19 0836  BP: 122/79  Pulse: 75  Resp: 18  Temp: 98.3 F (36.8 C)  SpO2: 100%   Filed Weights   07/23/19 0836  Weight: 190 lb 14.4 oz (86.6 kg)    LABORATORY  DATA:  I have reviewed the data as listed CMP Latest Ref Rng & Units 03/07/2019 02/12/2019 01/30/2018  Glucose 70 - 99 mg/dL 90 79 81  BUN 6 - 20 mg/dL 9 10 10   Creatinine 0.44 - 1.00 mg/dL 0.82 0.84 0.85  Sodium 135 - 145 mmol/L 141 138 136  Potassium 3.5 - 5.1 mmol/L 4.0 4.2 3.9  Chloride 98 - 111 mmol/L 105 105 101  CO2 22 - 32 mmol/L 25 21 21   Calcium 8.9 - 10.3 mg/dL 9.3 9.0 8.8  Total Protein 6.5 - 8.1 g/dL 8.0 7.0 6.9  Total Bilirubin 0.3 - 1.2 mg/dL 0.2(L) 0.3 0.2  Alkaline Phos 38 - 126 U/L 97 99 86  AST 15 - 41 U/L 20 17 15   ALT 0 - 44 U/L 26 18 19     Lab Results  Component Value Date   WBC 8.3 03/07/2019   HGB 13.2 03/07/2019   HCT 40.8 03/07/2019   MCV 88.5 03/07/2019   PLT 227 03/07/2019   NEUTROABS 5.3 03/07/2019    ASSESSMENT & PLAN:  Malignant neoplasm of lower-outer quadrant of right breast of female, estrogen receptor positive ([place]) 03/21/2019:Right lumpectomy [name]): IDC, 2.5cm, grade 2, 4 right axillary lymph nodes negative for carcinoma.  ER 90%, PR 20%, Ki-67 2%, HER-2 negative T2N0 stage Ia Genetic testing: Negative 04/30/2019: Margin reexcision: Residual IDC 0.6 cm grade 2 Oncotype DX score: 14: Risk of recurrence: 4% Adjuvant radiation 06/14/2019-07/11/2019  Recommendation: Adjuvant antiestrogen therapy with tamoxifen 20 mg daily to be switched to anastrozole once she is menopausal. We discussed the risks and benefits of tamoxifen. These include but not limited to insomnia, hot flashes, mood changes, vaginal dryness, and weight gain. Although rare, serious side effects including endometrial cancer, risk of blood clots were also discussed. We strongly believe that the benefits far outweigh the risks. Patient understands these risks and consented to starting treatment.   I encouraged her to participate in upbeat and antiestrogen therapy compliance clinical trials Return to clinic in 3 months for survivorship care plan visit    No orders of the defined  types were placed in this encounter.  The patient has a good understanding of the overall plan. she agrees with it. she will call with any problems that may develop before the next visit here.  Total time spent: 30 mins including face to face time and time spent for planning, charting and coordination of care  Nicholas Lose, MD 07/23/2019  I, Cloyde Reams Dorshimer, am acting as scribe for Dr. Nicholas Lose.  I have reviewed the above documentation for accuracy and completeness, and I agree with the above.

## 2019-07-23 ENCOUNTER — Telehealth: Payer: Self-pay | Admitting: Hematology and Oncology

## 2019-07-23 ENCOUNTER — Telehealth: Payer: Self-pay | Admitting: *Deleted

## 2019-07-23 ENCOUNTER — Other Ambulatory Visit: Payer: Self-pay

## 2019-07-23 ENCOUNTER — Encounter: Payer: Self-pay | Admitting: Radiology

## 2019-07-23 ENCOUNTER — Inpatient Hospital Stay: Payer: 59 | Attending: Hematology and Oncology | Admitting: Hematology and Oncology

## 2019-07-23 DIAGNOSIS — Z79810 Long term (current) use of selective estrogen receptor modulators (SERMs): Secondary | ICD-10-CM | POA: Diagnosis not present

## 2019-07-23 DIAGNOSIS — C50511 Malignant neoplasm of lower-outer quadrant of right female breast: Secondary | ICD-10-CM | POA: Insufficient documentation

## 2019-07-23 DIAGNOSIS — Z170 Estrogen receptor positive status [ER+]: Secondary | ICD-10-CM | POA: Insufficient documentation

## 2019-07-23 DIAGNOSIS — Z923 Personal history of irradiation: Secondary | ICD-10-CM | POA: Insufficient documentation

## 2019-07-23 MED ORDER — TAMOXIFEN CITRATE 20 MG PO TABS: 20.0000 mg | ORAL_TABLET | Freq: Every day | ORAL | 3 refills | Status: DC

## 2019-07-23 NOTE — Telephone Encounter (Signed)
Scheduled per 07/12 los, patient received updated calender.

## 2019-07-23 NOTE — Research (Signed)
UPBEAT [id number] - UNDERSTANDING and PREDICTING BREAST CANCER EVENTS AFTER TREATMENT  07/23/2019 8:50AM  REFERRAL:Met with patient(unaccompanied today)inexam room 101fr20minutes following herprovider appointment to discuss potentialvoluntary participation in the [id number] study. Wereviewedtheconsent form for Group 1: Cancer Treatment Group (protocol version date 02/14/19, [facility] Active Date 03/22/19), including the voluntary nature of participation, the study purpose, study procedures including cardiac assessment via ECHO or cardiac MRI, patientquestionnaires, physical function tests,study participation risks and benefits, costs and compensation, optional studies, and who will see the provided information. TCodeeexpressed concerns about the additional amount of time that she would need to take off work. I conveyed my understanding and thanked her for her time and willingness to allow me to discuss this study with her. The plan is to touch base with her in a couple of days to ask about possible interest in DCP. TMarzellehas a follow-up appointment from radiation on 7/29 with Dr. KSondra Comeand I would like to meet with her after that appointment if she is interested in participation in DCP. TMckinzywas escorted from the room and thanked for her time and consideration of participation in the UPBEAT study. I have completed a Decline Form for this study.   CCarol AdaRT(R)(T) Clinical Research Coordinator 07/23/19 9:47AM

## 2019-07-23 NOTE — Assessment & Plan Note (Signed)
03/21/2019:Right lumpectomy ([place]): IDC, 2.5cm, grade 2, 4 right axillary lymph nodes negative for carcinoma.  ER 90%, PR 20%, Ki-67 2%, HER-2 negative T2N0 stage Ia Genetic testing: Negative 04/30/2019: Margin reexcision: Residual IDC 0.6 cm grade 2 Oncotype DX score: 14: Risk of recurrence: 4% Adjuvant radiation 06/14/2019-07/11/2019  Recommendation: Adjuvant antiestrogen therapy with anastrozole 1 mg p.o. daily x5 to 7 years Anastrozole counseling:We discussed the risks and benefits of anti-estrogen therapy with aromatase inhibitors. These include but not limited to insomnia, hot flashes, mood changes, vaginal dryness, bone density loss, and weight gain. We strongly believe that the benefits far outweigh the risks. Patient understands these risks and consented to starting treatment. Planned treatment duration is 5-7 years.  I encouraged her to participate in upbeat and antiestrogen therapy compliance clinical trials Return to clinic in 3 months for survivorship care plan visit 

## 2019-07-23 NOTE — Telephone Encounter (Signed)
Returned call to Cassandra Torres [phone]) requesting new FMLA form.  No blank copy on hand as requested by patient to use for new FMLA for Med/Onc.    "My manager said to obtain FMLA but Human Resources recommended I take time off for appointments uncompensated.  I have returned to work.  Do not have any PAL hours so I am not going to take FMLA which used PAL everytime I had appointments."  With this call confirmed decision not to request FMLA as voicemail "requesting FMLA form for future medical oncology, endocrinology and dental appointments coming up for problems arising from radiation and estrogen pill therapy."

## 2019-07-24 ENCOUNTER — Encounter: Payer: Self-pay | Admitting: Radiology

## 2019-07-24 DIAGNOSIS — C50511 Malignant neoplasm of lower-outer quadrant of right female breast: Secondary | ICD-10-CM

## 2019-07-24 NOTE — Research (Signed)
DCP-001 USE OF A CLINICAL TRIAL SCREENING TOOL TO ADDRESS CANCER HEALTH DISPARITIES IN THE [employer] COMMUNITY ONCOLOGY RESEARCH PROGRAM ([facility])  07/24/19 1:53PM  PHONE CALL:Confirmed I was speaking with Cassandra Torres. Cassandra Torres returned my phone call about possibly participating in DCP. Patient declined UPBEAT due to time commitment. I offered DCP as a follow-up and patient said she was interested. WereviewedtheDCP-001 study, including the voluntary nature of participation and the project purpose. Cassandra Torres verbalizes understanding that this study is a one-time consent for collection of demographic variables and that no patient identifiers are being reported. I plan on meeting with her after she sees Dr. Sondra Come on 7/29 at 4:30PM in order to review the DCP consent and complete the DCP worksheet. I thanked her for her time and I look forward to seeing her again on 7/29.   [place] RT(R)(T) Clinical Research Coordinator 2:38PM

## 2019-07-25 NOTE — Progress Notes (Incomplete)
  Patient Name: Cassandra Torres MRN: [id number] DOB: Sep 21, 1965 Referring Physician: Glendale Chard (Profile Not Attached) Date of Service: 07/11/2019 [facility], [place]                                                        End Of Treatment Note  Diagnoses: C50.511-Malignant neoplasm of lower-outer quadrant of right female breast Z17.0-Estrogen receptor positive status [ER+]  Cancer Staging: StageIA(pT2, pN0, cM0)RightBreast LOQ,Invasive DuctalCarcinoma, ER+/ PR+/ Her2-, Grade2  Intent: Curative  Radiation Treatment Dates: 06/13/2019 through 07/11/2019 Site Technique Total Dose (Gy) Dose per Fx (Gy) Completed Fx Beam Energies  Breast, Right: Breast_Rt 3D 40.05/40.05 2.67 15/15 6X, 10X  Breast, Right: Breast_Rt_Bst 3D 12/12 2 6/6 6X, 10X   Narrative: The patient tolerated radiation therapy relatively well. She did report some mild itching that was relieved with Benadryl. She also reported mild fatigue and an occasional "electric sensation". During the first week of treatment, the right breast was noted to have some mild hyperpigmentation changes. As treatment continued, the patient was noted to have moderate hyperpigmentation and minimal erythema without any skin breakdown in the right breast area.  Plan: The patient will follow-up with radiation oncology in one month.  ________________________________________________   Blair Promise, PhD, MD  This document serves as a record of services personally performed by Gery Pray, MD. It was created on his behalf by Clerance Lav, a trained medical scribe. The creation of this record is based on the scribe's personal observations and the provider's statements to them. This document has been checked and approved by the attending provider.

## 2019-08-09 ENCOUNTER — Inpatient Hospital Stay: Payer: 59 | Admitting: Radiology

## 2019-08-09 ENCOUNTER — Ambulatory Visit
Admission: RE | Admit: 2019-08-09 | Discharge: 2019-08-09 | Disposition: A | Discharge status: Home / Self Care | Payer: 59 | Source: Ambulatory Visit | Attending: Radiation Oncology | Admitting: Radiation Oncology

## 2019-08-09 ENCOUNTER — Encounter: Payer: Self-pay | Admitting: Radiation Oncology

## 2019-08-09 ENCOUNTER — Encounter: Payer: Self-pay | Admitting: Radiology

## 2019-08-09 ENCOUNTER — Other Ambulatory Visit: Payer: Self-pay

## 2019-08-09 VITALS — BP 123/79 | HR 74 | Temp 98.6°F | Resp 20 | Ht 68.5 in | Wt 192.6 lb

## 2019-08-09 DIAGNOSIS — Z170 Estrogen receptor positive status [ER+]: Secondary | ICD-10-CM | POA: Diagnosis not present

## 2019-08-09 DIAGNOSIS — Z923 Personal history of irradiation: Secondary | ICD-10-CM | POA: Insufficient documentation

## 2019-08-09 DIAGNOSIS — C50511 Malignant neoplasm of lower-outer quadrant of right female breast: Secondary | ICD-10-CM | POA: Insufficient documentation

## 2019-08-09 DIAGNOSIS — Z79899 Other long term (current) drug therapy: Secondary | ICD-10-CM | POA: Diagnosis not present

## 2019-08-09 DIAGNOSIS — Z79810 Long term (current) use of selective estrogen receptor modulators (SERMs): Secondary | ICD-10-CM | POA: Diagnosis not present

## 2019-08-09 NOTE — Research (Signed)
DCP-001 USE OF A CLINICAL TRIAL SCREENING TOOL TO ADDRESS CANCER HEALTH DISPARITIES IN THE [employer] COMMUNITY ONCOLOGY RESEARCH PROGRAM ([facility]ORP)  08/09/19 3:45PM  DCP-001CONSENT:Discussed DCP-001 with Cassandra Torres in a private exam room in rad onc for 20 minutes. WereviewedtheDCP-001 consent form (protocol version date 07/04/18, [place] Active Date 01/16/19) in full, including the voluntary nature of participation, the project purpose, risks and benefits, and who will see the provided information. [name] verbalizes understanding that this study is a one-time consent for collection of demographic variables and that no patient identifiers are being reported. We also reviewed the [place] form (dated 03/04/14) including the data to be collected, why the information is being collected, who will see the information, and safety measures to keep information private.Upon completion of review,Cassandra Torres verbalizes a desire to voluntarily participate in DCP-001. All consent and authorization forms were signed and dated by the patient andmyself as the person obtaining consent andsigned copiesof both documentswere providedtopatient.  DCP-001 WORKSHEET:After informed consent was obtained, the DCP-001 Worksheet wasgiven to patientto collect ethnicity, language, literacy, education, employment, income and smoking history not available in the electronic medical record. Patient was thanked for her time.  OPEN: Patient was registered in [place] and all paperwork was given to Remer Macho, [place] Specialist. Participant ID: [id number].  [place] RT(R)(T) Clinical Research Coordinator 4:16PM

## 2019-08-09 NOTE — Progress Notes (Signed)
Radiation Oncology         [phone]) [phone] ________________________________  Name: Cassandra Torres MRN: [id number]  Date: 08/09/2019  DOB: March 25, 1965  Follow-Up Visit Note  CC: Glendale Chard, MD  Glendale Chard, MD    ICD-10-CM   1. Malignant neoplasm of lower-outer quadrant of right breast of female, estrogen receptor positive ([place])  C50.511    Z17.0     Diagnosis: StageIA(pT2, pN0, cM0)RightBreast LOQ,Invasive DuctalCarcinoma, ER+/ PR+/ Her2-, Grade2  Interval Since Last Radiation: One month.  Radiation Treatment Dates: 06/13/2019 through 07/11/2019 Site Technique Total Dose (Gy) Dose per Fx (Gy) Completed Fx Beam Energies  Breast, Right: Breast_Rt 3D 40.05/40.05 2.67 15/15 6X, 10X  Breast, Right: Breast_Rt_Bst 3D 12/12 2 6/6 6X, 10X    Narrative:  The patient returns today for routine follow-up. Since the end of treatment, she was seen by Dr. Lindi Adie on 07/23/2019. At that time, it was recommended that she proceed with adjuvant antiestrogen therapy with Anastrozole for five to seven years. She declined UPBEAT participation due to commitment.  Since the patient is premenopausal however she will start on tamoxifen.  On review of systems, she reports no further fatigue. She denies any pain within the breast significant itching nipple discharge or bleeding                ALLERGIES:  is allergic to levaquin [levofloxacin], penicillins, and sulfur.  Meds: Current Outpatient Medications  Medication Sig Dispense Refill  . cholecalciferol (VITAMIN D3) 25 MCG (1000 UNIT) tablet Take 2,000 Units by mouth daily. Taking OTC vit D gummies    . loratadine (CLARITIN) 10 MG tablet Take by mouth.    . Multiple Vitamin (MULTIVITAMINS PO) Take by mouth.    . diphenhydrAMINE (BENADRYL) 25 mg capsule Take 25 mg by mouth as needed.    Marland Kitchen ELDERBERRY PO Take 50 mg by mouth daily. (Patient not taking: Reported on 08/09/2019)    . oxyCODONE (OXY IR/ROXICODONE) 5 MG immediate release tablet Take 1  tablet (5 mg total) by mouth every 6 (six) hours as needed. (Patient not taking: Reported on 05/21/2019) 6 tablet 0  . [START ON 08/12/2019] tamoxifen (NOLVADEX) 20 MG tablet Take 1 tablet (20 mg total) by mouth daily. (Patient not taking: Reported on 08/09/2019) 90 tablet 3   No current facility-administered medications for this encounter.    Physical Findings: The patient is in no acute distress. Patient is alert and oriented.  height is 5' 8.5" (1.74 m) and weight is 192 lb 9.6 oz (87.4 kg). Her temperature is 98.6 F (37 C). Her blood pressure is 123/79 and her pulse is 74. Her respiration is 20 and oxygen saturation is 100%.   No significant changes. Lungs are clear to auscultation bilaterally. Heart has regular rate and rhythm. No palpable cervical, supraclavicular, or axillary adenopathy. Abdomen soft, non-tender, normal bowel sounds. Left breast: No palpable mass, nipple discharge, or bleeding.  Right breast: Hyperpigmentation changes noted.  Minimal edema noted in the breast.  Patient does have some induration at the lumpectomy scar but no dominant mass within the breast.  Lab Findings: Lab Results  Component Value Date   WBC 8.3 03/07/2019   HGB 13.2 03/07/2019   HCT 40.8 03/07/2019   MCV 88.5 03/07/2019   PLT 227 03/07/2019    Radiographic Findings: No results found.  Impression: StageIA(pT2, pN0, cM0)RightBreast LOQ,Invasive DuctalCarcinoma, ER+/ PR+/ Her2-, Grade2  The patient has recovered well from her radiation therapy  Plan: As needed follow-up in radiation oncology.  Patient  will proceed with tamoxifen therapy as above and continue follow-up with Dr. Lindi Adie.    ___________________________________   Blair Promise, PhD, MD  This document serves as a record of services personally performed by Gery Pray, MD. It was created on his behalf by Clerance Lav, a trained medical scribe. The creation of this record is based on the scribe's personal observations  and the provider's statements to them. This document has been checked and approved by the attending provider.

## 2019-08-09 NOTE — Progress Notes (Signed)
Patient here for a 1 month f/u visit with Dr. Sondra Come. Patient reports an occasional brief electrical sensation sensation under her right breast. Patient denies fatigue and reports her skin is healing.  BP 123/79 (BP Location: Left Arm, Patient Position: Sitting, Cuff Size: Normal)   Pulse 74   Temp 98.6 F (37 C)   Resp 20   Ht 5' 8.5" (1.74 m)   Wt 192 lb 9.6 oz (87.4 kg)   SpO2 100%   BMI 28.86 kg/m    Wt Readings from Last 3 Encounters:  08/09/19 192 lb 9.6 oz (87.4 kg)  07/23/19 190 lb 14.4 oz (86.6 kg)  05/21/19 188 lb 12.8 oz (85.6 kg)

## 2019-08-10 ENCOUNTER — Other Ambulatory Visit: Payer: Self-pay | Admitting: Adult Health

## 2019-08-10 NOTE — Progress Notes (Signed)
Called patient to let her know that I activated her surveys in my chart for AET monitoring study.  LMOM.   Wilber Bihari, NP

## 2019-10-23 ENCOUNTER — Telehealth: Payer: Self-pay | Admitting: Adult Health

## 2019-10-23 NOTE — Telephone Encounter (Signed)
Per sch msg, patient wants to r/s 10/13. Called and left msg for patient to call back to r/s due to there not being availability for a while. No changes were made at this time

## 2019-10-23 NOTE — Telephone Encounter (Signed)
Patient called back to r/s 10/13 appt. Moved to 11/24 per patient request

## 2019-10-24 ENCOUNTER — Inpatient Hospital Stay: Payer: 59 | Admitting: Adult Health

## 2019-11-01 ENCOUNTER — Encounter (HOSPITAL_COMMUNITY): Payer: Self-pay

## 2019-11-06 ENCOUNTER — Telehealth: Payer: Self-pay

## 2019-11-06 NOTE — Telephone Encounter (Signed)
I returned the pt's call and left her a message that Dr. Baird Cancer is going to do the referral for the pt to Dr. [name] at [facility] and that the pt needed a f/u appt with Dr. Baird Cancer next month.

## 2019-11-23 ENCOUNTER — Other Ambulatory Visit: Payer: Self-pay | Admitting: Endocrinology

## 2019-11-23 ENCOUNTER — Other Ambulatory Visit (HOSPITAL_COMMUNITY): Payer: Self-pay | Admitting: Endocrinology

## 2019-11-23 DIAGNOSIS — E0590 Thyrotoxicosis, unspecified without thyrotoxic crisis or storm: Secondary | ICD-10-CM

## 2019-12-05 ENCOUNTER — Telehealth: Payer: Self-pay | Admitting: Hematology and Oncology

## 2019-12-05 ENCOUNTER — Other Ambulatory Visit: Payer: Self-pay

## 2019-12-05 ENCOUNTER — Encounter: Payer: Self-pay | Admitting: Adult Health

## 2019-12-05 ENCOUNTER — Inpatient Hospital Stay: Payer: 59 | Attending: Adult Health | Admitting: Adult Health

## 2019-12-05 VITALS — BP 113/94 | HR 91 | Temp 96.8°F | Resp 18 | Ht 68.5 in | Wt 199.7 lb

## 2019-12-05 DIAGNOSIS — Z923 Personal history of irradiation: Secondary | ICD-10-CM | POA: Insufficient documentation

## 2019-12-05 DIAGNOSIS — Z808 Family history of malignant neoplasm of other organs or systems: Secondary | ICD-10-CM | POA: Diagnosis not present

## 2019-12-05 DIAGNOSIS — Z79899 Other long term (current) drug therapy: Secondary | ICD-10-CM | POA: Insufficient documentation

## 2019-12-05 DIAGNOSIS — Z8042 Family history of malignant neoplasm of prostate: Secondary | ICD-10-CM | POA: Insufficient documentation

## 2019-12-05 DIAGNOSIS — Z170 Estrogen receptor positive status [ER+]: Secondary | ICD-10-CM | POA: Diagnosis not present

## 2019-12-05 DIAGNOSIS — C50511 Malignant neoplasm of lower-outer quadrant of right female breast: Secondary | ICD-10-CM | POA: Insufficient documentation

## 2019-12-05 DIAGNOSIS — Z79811 Long term (current) use of aromatase inhibitors: Secondary | ICD-10-CM | POA: Diagnosis not present

## 2019-12-05 DIAGNOSIS — Z803 Family history of malignant neoplasm of breast: Secondary | ICD-10-CM | POA: Diagnosis not present

## 2019-12-05 DIAGNOSIS — R6882 Decreased libido: Secondary | ICD-10-CM | POA: Diagnosis not present

## 2019-12-05 DIAGNOSIS — Z8249 Family history of ischemic heart disease and other diseases of the circulatory system: Secondary | ICD-10-CM | POA: Diagnosis not present

## 2019-12-05 NOTE — Progress Notes (Signed)
SURVIVORSHIP VISIT:    BRIEF ONCOLOGIC HISTORY:  Oncology History  Malignant neoplasm of lower-outer quadrant of right breast of female, estrogen receptor positive ([place])  03/02/2019 Initial Diagnosis   Screening mammogram showed right breast masses. Diagnostic mammogram and US showed a 1.2cm mass at the 7 o'clock position 7cm from the nipple, and a 1.1cm mass at the 7 o'clock position 6cm from the nipple, spanning 2.3cm in total, with no evidence of right axillary adenopathy. Biopsy showed IDC, grade 3, HER-2 equivocal by IHC, negative by FISH, ER+ 90%, PR+ 20%, Ki67 2%.    03/17/2019 Genetic Testing   Negative genetic testing. No pathogenic variants identified on the [facility] Breast Cancer STAT Panel + Common Hereditary Cancers Panel + Brain Cancer Panel. VUS in KIT called [id number] identified. The report date is 03/17/2019.  The Breast Cancer STAT Panel + Common Hereditary Cancers Panel + Brain Cancer Panel offered by [facility] includes sequencing and/or deletion duplication testing of the following 70 genes: AIP, ALK, APC*, ATM*, AXIN2, BAP1, BARD1, BMPR1A, BRCA1, BRCA2, BRIP1, CDH1, CDK4, CDKN2A (p14ARF), CDKN2A (p16INK4a), CHEK2, CTNNA1, DICER1*, EPCAM*, EZH2*, GPC3*, GREM1*, HOXB13, HRAS, KIF1B*, KIT, LZTR1, MAX*, MEN1*, MLH1*, MSH2*, MSH3*, MSH6*, MUTYH, NBN, NF1*, NF2, NTHL1, PALB2, PDGFRA, PHOX2B*, PMS2*, POLD1*, POLE, POT1, PRKAR1A, PTCH1, PTCH2, PTEN*, RAD50, RAD51C, RAD51D, RB1*, RET, RNF43, SDHA*, SDHAF2, SDHB, SDHC*, SDHD, SMAD4, SMARCA4, SMARCB1, SMARCE1, STK11, SUFU, TMEM127, TP53, TSC1*, TSC2, VHL.   03/21/2019 Surgery   Right lumpectomy [name]) [phone]): IDC, 2.5cm, grade 2, 4 right axillary lymph nodes negative for carcinoma. Carcinoma focally involved posterior and medial margins and was present <1 mm from the lateral margin.   03/30/2019 Cancer Staging   Staging form: Breast, AJCC 8th Edition - Pathologic stage from 03/30/2019: Stage IA (pT2, pN0, cM0, G2, ER+, PR+, HER2-)    03/30/2019 Oncotype testing   The Oncotype DX score was 14 predicting a risk of outside the breast recurrence over the next 9 years of 4% if the patient's only systemic therapy is tamoxifen for 5 years.    04/30/2019 Surgery   Re-excision of positive margin [name]) [phone]): remaining invasive ductal carcinoma, 0.6cm, grade 2.   06/14/2019 - 07/11/2019 Radiation Therapy   The patient initially received a dose of 40.05 Gy in 15 fractions to the breast using whole-breast tangent fields. This was delivered using a 3-D conformal technique. The pt received a boost delivering an additional 12 Gy in 6 fractions using a electron boost with 24mV electrons. The total dose was 52.05 Gy.   08/2019 -  Anti-estrogen oral therapy   Tamoxifen, to be switched to Anastrozole once she is menopausal     INTERVAL HISTORY:  Cassandra Torres review her survivorship care plan detailing her treatment course for breast cancer, as well as monitoring long-term side effects of that treatment, education regarding health maintenance, screening, and overall wellness and health promotion.     Overall, Cassandra Torres reports feeling well.  She is taking tamoxifen daily.  She notes some mild distress over affording her health care costs, and some vaginal dryness that is noticeable during intercourse.  She has used KY jelly as a lubricant.  She has a mildly decreased libido, but that will respond to gently touching and initiation of physical intimacy by her husband.  She notes her last menstrual cycle was about 10 months ago.    REVIEW OF SYSTEMS:  Review of Systems  Constitutional: Negative for appetite change, chills, fatigue, fever and unexpected weight change.  HENT:   Negative for  hearing loss, lump/mass and trouble swallowing.   Eyes: Negative for eye problems and icterus.  Respiratory: Negative for chest tightness, cough and shortness of breath.   Cardiovascular: Negative for chest pain, leg swelling and palpitations.   Gastrointestinal: Negative for abdominal distention, abdominal pain, constipation, diarrhea, nausea and vomiting.  Endocrine: Negative for hot flashes.  Genitourinary: Negative for difficulty urinating.   Musculoskeletal: Negative for arthralgias.  Skin: Negative for itching and rash.  Neurological: Negative for dizziness, extremity weakness, headaches and numbness.  Hematological: Negative for adenopathy. Does not bruise/bleed easily.  Psychiatric/Behavioral: Negative for depression. The patient is not nervous/anxious.   Breast: Denies any new nodularity, masses, tenderness, nipple changes, or nipple discharge.      ONCOLOGY TREATMENT TEAM:  1. Surgeon:  Dr. [name] at [facility] 2. Medical Oncologist: Dr. Lindi Adie  3. Radiation Oncologist: Dr. Sondra Come    PAST MEDICAL/SURGICAL HISTORY:  Past Medical History:  Diagnosis Date  . Cancer ([place]) 02/2019   right breast IDC  . Family history of brain cancer   . Family history of breast cancer   . Family history of prostate cancer   . PONV (postoperative nausea and vomiting)   . Seasonal allergies   . Thyrotoxicosis    Past Surgical History:  Procedure Laterality Date  . BREAST LUMPECTOMY WITH RADIOACTIVE SEED AND SENTINEL LYMPH NODE BIOPSY Right 03/21/2019   Procedure: RIGHT BREAST LUMPECTOMY WITH BRACKETED RADIOACTIVE SEED AND SENTINEL LYMPH NODE BIOPSY;  Surgeon: Rolm Bookbinder, MD;  Location: [facility];  Service: General;  Laterality: Right;  . COLONOSCOPY    . COLPOSCOPY    . RE-EXCISION OF BREAST CANCER,SUPERIOR MARGINS Right 04/30/2019   Procedure: RE-EXCISION OF RIGHT BREAST MARGINS;  Surgeon: Rolm Bookbinder, MD;  Location: [facility];  Service: General;  Laterality: Right;     ALLERGIES:  Allergies  Allergen Reactions  . Levaquin [Levofloxacin] Hives  . Penicillins Hives  . Sulfur Hives     CURRENT MEDICATIONS:  Outpatient Encounter Medications as of 12/05/2019   Medication Sig  . cholecalciferol (VITAMIN D3) 25 MCG (1000 UNIT) tablet Take 2,000 Units by mouth daily. Taking OTC vit D gummies  . diphenhydrAMINE (BENADRYL) 25 mg capsule Take 25 mg by mouth as needed.  Marland Kitchen ELDERBERRY PO Take 50 mg by mouth daily.   Marland Kitchen loratadine (CLARITIN) 10 MG tablet Take by mouth.  . Multiple Vitamin (MULTIVITAMINS PO) Take by mouth.  . tamoxifen (NOLVADEX) 20 MG tablet Take 1 tablet (20 mg total) by mouth daily.  . [DISCONTINUED] oxyCODONE (OXY IR/ROXICODONE) 5 MG immediate release tablet Take 1 tablet (5 mg total) by mouth every 6 (six) hours as needed.   No facility-administered encounter medications on file as of 12/05/2019.     ONCOLOGIC FAMILY HISTORY:  Family History  Problem Relation Age of Onset  . Hypertension Mother   . Healthy Father   . Prostate cancer Maternal Grandfather        dx 36s  . Breast cancer Paternal Aunt        dx 3s  . Brain cancer Maternal Grandmother        d. 62     GENETIC COUNSELING/TESTING: See above  SOCIAL HISTORY:  Social History   Socioeconomic History  . Marital status: Married    Spouse name: Not on file  . Number of children: Not on file  . Years of education: Not on file  . Highest education level: Not on file  Occupational History  . Not  on file  Tobacco Use  . Smoking status: Never Smoker  . Smokeless tobacco: Never Used  Vaping Use  . Vaping Use: Never used  Substance and Sexual Activity  . Alcohol use: Yes    Comment: occasional  . Drug use: Never  . Sexual activity: Yes    Birth control/protection: None    Comment: husband has had vasectomy  Other Topics Concern  . Not on file  Social History Narrative  . Not on file   Social Determinants of Health   Financial Resource Strain:   . Difficulty of Paying Living Expenses: Not on file  Food Insecurity:   . Worried About Charity fundraiser in the Last Year: Not on file  . Ran Out of Food in the Last Year: Not on file  Transportation  Needs:   . Lack of Transportation (Medical): Not on file  . Lack of Transportation (Non-Medical): Not on file  Physical Activity:   . Days of Exercise per Week: Not on file  . Minutes of Exercise per Session: Not on file  Stress:   . Feeling of Stress : Not on file  Social Connections:   . Frequency of Communication with Friends and Family: Not on file  . Frequency of Social Gatherings with Friends and Family: Not on file  . Attends Religious Services: Not on file  . Active Member of Clubs or Organizations: Not on file  . Attends Archivist Meetings: Not on file  . Marital Status: Not on file  Intimate Partner Violence:   . Fear of Current or Ex-Partner: Not on file  . Emotionally Abused: Not on file  . Physically Abused: Not on file  . Sexually Abused: Not on file     OBSERVATIONS/OBJECTIVE:  BP (!) 113/94 (BP Location: Left Arm, Patient Position: Sitting)   Pulse 91   Temp (!) 96.8 F (36 C) (Tympanic)   Resp 18   Ht 5' 8.5" (1.74 m)   Wt 199 lb 11.2 oz (90.6 kg)   SpO2 100%   BMI 29.92 kg/m  GENERAL: Patient is a well appearing female in no acute distress HEENT:  Sclerae anicteric.  Mask in place. Neck is supple.  NODES:  No cervical, supraclavicular, or axillary lymphadenopathy palpated.  BREAST EXAM:  S/p right breast lumpectomy and radiation, no sign of local recurrence, left breast benign LUNGS:  Clear to auscultation bilaterally.  No wheezes or rhonchi. HEART:  Regular rate and rhythm. No murmur appreciated. ABDOMEN:  Soft, nontender.  Positive, normoactive bowel sounds. No organomegaly palpated. MSK:  No focal spinal tenderness to palpation. Full range of motion bilaterally in the upper extremities. EXTREMITIES:  No peripheral edema.   SKIN:  Clear with no obvious rashes or skin changes. No nail dyscrasia. NEURO:  Nonfocal. Well oriented.  Appropriate affect.  LABORATORY DATA:  None for this visit.  DIAGNOSTIC IMAGING:  None for this visit.       ASSESSMENT AND PLAN:  Ms.. Torres is a pleasant 54 y.o. female with Stage IA right breast invasive ductal carcinoma, ER+/PR+/HER2-, diagnosed in 02/2019, treated with lumpectomy, adjuvant radiation therapy, and anti-estrogen therapy with Tamoxifen beginning in 08/2019.  She presents to the Survivorship Clinic for our initial meeting and routine follow-up post-completion of treatment for breast cancer.    1. Stage IA right breast cancer:  Ms. Gotay is continuing to recover from definitive treatment for breast cancer. She will follow-up with her medical oncologist, Dr. Lindi Adie in 6 months with history and  physical exam per surveillance protocol.  She will continue her anti-estrogen therapy with Tamoxifen. Thus far, she is tolerating the Tamoxifen well, with minimal side effects. She was instructed to make Dr. Lindi Adie or myself aware if she begins to experience any worsening side effects of the medication and I could see her back in clinic to help manage those side effects, as needed. Her mammogram is due 01/2020; orders placed today.  Her breast density is category B. Today, a comprehensive survivorship care plan and treatment summary was reviewed with the patient today detailing her breast cancer diagnosis, treatment course, potential late/long-term effects of treatment, appropriate follow-up care with recommendations for the future, and patient education resources.  A copy of this summary, along with a letter will be sent to the patient's primary care provider via mail/fax/In Basket message after today's visit.    2. Vaginal Dryness: Recommended vitamin e suppositories and astroglide as a lubricant.  For her libido, I recommended they continue with her spouse initiating if that is working.  3. Bone health:  She was given education on specific activities to promote bone health.  4. Cancer screening:  Due to Ms. Thurner's history and her age, she should receive screening for skin cancers, colon cancer, and  gynecologic cancers.  The information and recommendations are listed on the patient's comprehensive care plan/treatment summary and were reviewed in detail with the patient.    5. Health maintenance and wellness promotion: Ms. Heidler was encouraged to consume 5-7 servings of fruits and vegetables per day. We reviewed the "Nutrition Rainbow" handout, as well as the handout "Take Control of Your Health and Reduce Your Cancer Risk" from the [facility].  She was also encouraged to engage in moderate to vigorous exercise for 30 minutes per day most days of the week. We discussed the [employer] fitness program, which is designed for cancer survivors to help them become more physically fit after cancer treatments.  She was instructed to limit her alcohol consumption and continue to abstain from tobacco use.     6. Support services/counseling: It is not uncommon for this period of the patient's cancer care trajectory to be one of many emotions and stressors.  We discussed how this can be increasingly difficult during the times of quarantine and social distancing due to the COVID-19 pandemic.   She was given information regarding our available services and encouraged to contact me with any questions or for help enrolling in any of our support group/programs.    Follow up instructions:    -Return to cancer center in 6 months for f/u with Dr. Lindi Adie  -Mammogram due in 01/2020 -Follow up with Dr. [name] in 11/2020 -She is welcome to return back to the Survivorship Clinic at any time; no additional follow-up needed at this time.  -Consider referral back to survivorship as a long-term survivor for continued surveillance  The patient was provided an opportunity to ask questions and all were answered. The patient agreed with the plan and demonstrated an understanding of the instructions.   Total encounter time: 45 minutes*  Wilber Bihari, NP 12/05/19 1:32 PM Medical Oncology and  Hematology [facility] [place],  [id number] Tel. [phone]    Fax. [phone]  *Total Encounter Time as defined by the Centers for Medicare and Medicaid Services includes, in addition to the face-to-face time of a patient visit (documented in the note above) non-face-to-face time: obtaining and reviewing outside history, ordering and reviewing medications, tests or  procedures, care coordination (communications with other health care professionals or caregivers) and documentation in the medical record.

## 2019-12-05 NOTE — Telephone Encounter (Signed)
Scheduled appts per 11/24 los. Gave pt a print out of AVS.

## 2019-12-17 ENCOUNTER — Encounter (HOSPITAL_COMMUNITY)
Admission: RE | Admit: 2019-12-17 | Discharge: 2019-12-17 | Disposition: A | Discharge status: Home / Self Care | Payer: 59 | Source: Ambulatory Visit | Attending: Endocrinology | Admitting: Endocrinology

## 2019-12-17 ENCOUNTER — Other Ambulatory Visit: Payer: Self-pay

## 2019-12-17 DIAGNOSIS — E0590 Thyrotoxicosis, unspecified without thyrotoxic crisis or storm: Secondary | ICD-10-CM | POA: Diagnosis not present

## 2019-12-17 MED ORDER — SODIUM IODIDE I-123 7.4 MBQ CAPS
429.4000 | ORAL_CAPSULE | Freq: Once | ORAL | Status: AC
  Administered 2019-12-17: 429.4 via ORAL

## 2019-12-18 ENCOUNTER — Encounter (HOSPITAL_COMMUNITY)
Admission: RE | Admit: 2019-12-18 | Discharge: 2019-12-18 | Disposition: A | Discharge status: Home / Self Care | Payer: 59 | Source: Ambulatory Visit | Attending: Endocrinology | Admitting: Endocrinology

## 2019-12-24 ENCOUNTER — Other Ambulatory Visit (HOSPITAL_COMMUNITY): Payer: Self-pay | Admitting: Endocrinology

## 2019-12-24 DIAGNOSIS — E0590 Thyrotoxicosis, unspecified without thyrotoxic crisis or storm: Secondary | ICD-10-CM

## 2020-01-07 ENCOUNTER — Encounter (HOSPITAL_COMMUNITY)
Admission: RE | Admit: 2020-01-07 | Discharge: 2020-01-07 | Disposition: A | Discharge status: Home / Self Care | Payer: 59 | Source: Ambulatory Visit | Attending: Endocrinology | Admitting: Endocrinology

## 2020-01-07 ENCOUNTER — Other Ambulatory Visit: Payer: Self-pay

## 2020-01-07 DIAGNOSIS — E0590 Thyrotoxicosis, unspecified without thyrotoxic crisis or storm: Secondary | ICD-10-CM | POA: Insufficient documentation

## 2020-01-07 MED ORDER — SODIUM IODIDE I 131 CAPSULE
20.6000 | Freq: Once | INTRAVENOUS | Status: AC | PRN
  Administered 2020-01-07: 20.6 via ORAL

## 2020-02-08 ENCOUNTER — Other Ambulatory Visit: Payer: Self-pay | Admitting: Adult Health

## 2020-02-08 ENCOUNTER — Other Ambulatory Visit: Payer: Self-pay

## 2020-02-08 ENCOUNTER — Ambulatory Visit
Admission: RE | Admit: 2020-02-08 | Discharge: 2020-02-08 | Disposition: A | Discharge status: Home / Self Care | Payer: 59 | Source: Ambulatory Visit | Attending: Adult Health | Admitting: Adult Health

## 2020-02-08 DIAGNOSIS — Z170 Estrogen receptor positive status [ER+]: Secondary | ICD-10-CM

## 2020-02-08 DIAGNOSIS — C50511 Malignant neoplasm of lower-outer quadrant of right female breast: Secondary | ICD-10-CM

## 2020-02-08 HISTORY — DX: Personal history of irradiation: Z92.3

## 2020-02-20 ENCOUNTER — Encounter: Payer: Managed Care, Other (non HMO) | Admitting: Internal Medicine

## 2020-02-20 ENCOUNTER — Telehealth (INDEPENDENT_AMBULATORY_CARE_PROVIDER_SITE_OTHER): Payer: 59 | Admitting: Internal Medicine

## 2020-02-20 ENCOUNTER — Encounter: Payer: Self-pay | Admitting: Internal Medicine

## 2020-02-20 VITALS — Ht 68.5 in

## 2020-02-20 DIAGNOSIS — Z170 Estrogen receptor positive status [ER+]: Secondary | ICD-10-CM

## 2020-02-20 DIAGNOSIS — R6884 Jaw pain: Secondary | ICD-10-CM

## 2020-02-20 DIAGNOSIS — C50511 Malignant neoplasm of lower-outer quadrant of right female breast: Secondary | ICD-10-CM | POA: Diagnosis not present

## 2020-02-20 DIAGNOSIS — E890 Postprocedural hypothyroidism: Secondary | ICD-10-CM | POA: Diagnosis not present

## 2020-02-20 NOTE — Patient Instructions (Signed)

## 2020-02-20 NOTE — Progress Notes (Signed)
Virtual Visit via Video   This visit type was conducted due to national recommendations for restrictions regarding the COVID-19 Pandemic (e.g. social distancing) in an effort to limit this patient's exposure and mitigate transmission in our community.  Due to her co-morbid illnesses, this patient is at least at moderate risk for complications without adequate follow up.  This format is felt to be most appropriate for this patient at this time.  All issues noted in this document were discussed and addressed.  A limited physical exam was performed with this format.    This visit type was conducted due to national recommendations for restrictions regarding the COVID-19 Pandemic (e.g. social distancing) in an effort to limit this patient's exposure and mitigate transmission in our community.  Patients identity confirmed using two different identifiers.  This format is felt to be most appropriate for this patient at this time.  All issues noted in this document were discussed and addressed.  No physical exam was performed (except for noted visual exam findings with Video Visits).    Date:  02/20/2020   ID:  Cassandra Torres, DOB 1965/12/31, MRN [id number]  Patient Location:  Work  Provider location:   [employer] Complaint:  "I need a referral"  History of Present Illness:    Cassandra Torres is a 55 y.o. female who presents via video conferencing for a telehealth visit today.    The patient does not have symptoms concerning for COVID-19 infection (fever, chills, cough, or new shortness of breath).   She presents today for virtual visit. She prefers this method of contact due to COVID-19 pandemic. She presents today for referral. She is scheduled to see her endocrinologist on Monday, February 14th. She is s/p radioactive ablation therapy. She is scheduled to start thyroid supplementation on Monday.     Past Medical History:  Diagnosis Date  . Cancer ([place]) 02/2019   right breast IDC  .  Family history of brain cancer   . Family history of breast cancer   . Family history of prostate cancer   . Personal history of radiation therapy   . PONV (postoperative nausea and vomiting)   . Seasonal allergies   . Thyrotoxicosis    Past Surgical History:  Procedure Laterality Date  . BREAST BIOPSY Right 02/27/2019   x2  . BREAST LUMPECTOMY Right 03/21/2019  . BREAST LUMPECTOMY WITH RADIOACTIVE SEED AND SENTINEL LYMPH NODE BIOPSY Right 03/21/2019   Procedure: RIGHT BREAST LUMPECTOMY WITH BRACKETED RADIOACTIVE SEED AND SENTINEL LYMPH NODE BIOPSY;  Surgeon: Rolm Bookbinder, MD;  Location: [facility];  Service: General;  Laterality: Right;  . COLONOSCOPY    . COLPOSCOPY    . RE-EXCISION OF BREAST CANCER,SUPERIOR MARGINS Right 04/30/2019   Procedure: RE-EXCISION OF RIGHT BREAST MARGINS;  Surgeon: Rolm Bookbinder, MD;  Location: [facility];  Service: General;  Laterality: Right;     No outpatient medications have been marked as taking for the 02/20/20 encounter (Video Visit) with Glendale Chard, MD.     Allergies:   Elemental sulfur, Levaquin [levofloxacin], and Penicillins   Social History   Tobacco Use  . Smoking status: Never Smoker  . Smokeless tobacco: Never Used  Vaping Use  . Vaping Use: Never used  Substance Use Topics  . Alcohol use: Yes    Comment: occasional  . Drug use: Never     Family Hx: The patient's family history includes Brain cancer in her maternal grandmother; Breast cancer in her paternal  aunt; Healthy in her father; Hypertension in her mother; Prostate cancer in her maternal grandfather.  ROS:   Please see the history of present illness.    Review of Systems  Constitutional: Negative.   HENT:       She c/o right sided jaw pain. She was recently evaluated by her dentist who states her teeth are normal. She denies pain with chewing. Unable to state what triggers her sx.   Respiratory: Negative.   Cardiovascular:  Negative.   Gastrointestinal: Negative.   Neurological: Negative.   Psychiatric/Behavioral: Negative.     All other systems reviewed and are negative.   Labs/Other Tests and Data Reviewed:    Recent Labs: 03/07/2019: ALT 26; BUN 9; Creatinine 0.82; Hemoglobin 13.2; Platelet Count 227; Potassium 4.0; Sodium 141   Recent Lipid Panel Lab Results  Component Value Date/Time   CHOL 203 (H) 02/12/2019 11:19 AM   TRIG 59 02/12/2019 11:19 AM   HDL 69 02/12/2019 11:19 AM   CHOLHDL 2.9 02/12/2019 11:19 AM   LDLCALC 123 (H) 02/12/2019 11:19 AM    Wt Readings from Last 3 Encounters:  12/05/19 199 lb 11.2 oz (90.6 kg)  08/09/19 192 lb 9.6 oz (87.4 kg)  07/23/19 190 lb 14.4 oz (86.6 kg)     Exam:    Vital Signs:  Ht 5' 8.5" (1.74 m)   BMI 29.92 kg/m     Physical Exam Vitals and nursing note reviewed.  Constitutional:      Appearance: Normal appearance.  HENT:     Head: Normocephalic and atraumatic.  Pulmonary:     Effort: Pulmonary effort is normal.  Musculoskeletal:     Cervical back: Normal range of motion.  Neurological:     Mental Status: She is alert and oriented to person, place, and time.  Psychiatric:        Mood and Affect: Affect normal.     ASSESSMENT & PLAN:     1. Postablative hypothyroidism Comments: I will place referral to Dr. [name] as requested.  - Ambulatory referral to Endocrinology  2. Jaw pain Comments: Suggestive of TMJ. She is advised to apply topical pain rub to affected area as needed. She is encouraged to let me know if her sx persist.   3. Malignant neoplasm of lower-outer quadrant of right breast of female, estrogen receptor positive ([place]) Comments: Oncology notes reviewed during her visit. She is s/p lumpectomy, radiation and is currently on adjuvant therapy. Referral placed for scheduled f/u appt.  - Ambulatory referral to Oncology   COVID-19 Education: The signs and symptoms of COVID-19 were discussed with the patient and how to seek  care for testing (follow up with PCP or arrange E-visit).  The importance of social distancing was discussed today.  Patient Risk:   After full review of this patients clinical status, I feel that they are at least moderate risk at this time.  Time:   Today, I have spent 17 minutes/ seconds with the patient with telehealth technology discussing above diagnoses.     Medication Adjustments/Labs and Tests Ordered: Current medicines are reviewed at length with the patient today.  Concerns regarding medicines are outlined above.   Tests Ordered: Orders Placed This Encounter  Procedures  . Ambulatory referral to Endocrinology  . Ambulatory referral to Oncology    Medication Changes: No orders of the defined types were placed in this encounter.   Disposition:  Follow up prn  Signed, Maximino Greenland, MD

## 2020-03-20 ENCOUNTER — Encounter: Payer: 59 | Admitting: Nurse Practitioner

## 2020-04-02 ENCOUNTER — Ambulatory Visit: Payer: 59 | Admitting: Nurse Practitioner

## 2020-04-02 ENCOUNTER — Encounter: Payer: Self-pay | Admitting: Nurse Practitioner

## 2020-04-02 ENCOUNTER — Other Ambulatory Visit: Payer: Self-pay

## 2020-04-02 VITALS — BP 128/80 | HR 95 | Temp 98.1°F | Ht 68.5 in | Wt 203.6 lb

## 2020-04-02 DIAGNOSIS — Z170 Estrogen receptor positive status [ER+]: Secondary | ICD-10-CM

## 2020-04-02 DIAGNOSIS — Z0000 Encounter for general adult medical examination without abnormal findings: Secondary | ICD-10-CM

## 2020-04-02 DIAGNOSIS — C50511 Malignant neoplasm of lower-outer quadrant of right female breast: Secondary | ICD-10-CM | POA: Diagnosis not present

## 2020-04-02 DIAGNOSIS — E890 Postprocedural hypothyroidism: Secondary | ICD-10-CM | POA: Diagnosis not present

## 2020-04-02 DIAGNOSIS — E7800 Pure hypercholesterolemia, unspecified: Secondary | ICD-10-CM | POA: Diagnosis not present

## 2020-04-02 DIAGNOSIS — Z1159 Encounter for screening for other viral diseases: Secondary | ICD-10-CM

## 2020-04-02 DIAGNOSIS — Z23 Encounter for immunization: Secondary | ICD-10-CM

## 2020-04-02 LAB — LIPID PANEL
Chol/HDL Ratio: 2.6 ratio (ref 0.0–4.4)
Cholesterol, Total: 196 mg/dL (ref 100–199)
HDL: 74 mg/dL (ref >39)
LDL Chol Calc (NIH): 109 mg/dL — ABNORMAL HIGH (ref 0–99)
Triglycerides: 69 mg/dL (ref 0–149)
VLDL Cholesterol Cal: 13 mg/dL (ref 5–40)

## 2020-04-02 MED ORDER — SHINGRIX 50 MCG/0.5ML IM SUSR: 0.5000 mL | Freq: Once | INTRAMUSCULAR | 0 refills | Status: AC

## 2020-04-02 NOTE — Patient Instructions (Signed)
Health Maintenance, Female Adopting a healthy lifestyle and getting preventive care are important in promoting health and wellness. Ask your health care provider about:  The right schedule for you to have regular tests and exams.  Things you can do on your own to prevent diseases and keep yourself healthy. What should I know about diet, weight, and exercise? Eat a healthy diet  Eat a diet that includes plenty of vegetables, fruits, low-fat dairy products, and lean protein.  Do not eat a lot of foods that are high in solid fats, added sugars, or sodium.   Maintain a healthy weight Body mass index (BMI) is used to identify weight problems. It estimates body fat based on height and weight. Your health care provider can help determine your BMI and help you achieve or maintain a healthy weight. Get regular exercise Get regular exercise. This is one of the most important things you can do for your health. Most adults should:  Exercise for at least 150 minutes each week. The exercise should increase your heart rate and make you sweat (moderate-intensity exercise).  Do strengthening exercises at least twice a week. This is in addition to the moderate-intensity exercise.  Spend less time sitting. Even light physical activity can be beneficial. Watch cholesterol and blood lipids Have your blood tested for lipids and cholesterol at 54 years of age, then have this test every 5 years. Have your cholesterol levels checked more often if:  Your lipid or cholesterol levels are high.  You are older than 54 years of age.  You are at high risk for heart disease. What should I know about cancer screening? Depending on your health history and family history, you may need to have cancer screening at various ages. This may include screening for:  Breast cancer.  Cervical cancer.  Colorectal cancer.  Skin cancer.  Lung cancer. What should I know about heart disease, diabetes, and high blood  pressure? Blood pressure and heart disease  High blood pressure causes heart disease and increases the risk of stroke. This is more likely to develop in people who have high blood pressure readings, are of African descent, or are overweight.  Have your blood pressure checked: ? Every 3-5 years if you are 18-39 years of age. ? Every year if you are 40 years old or older. Diabetes Have regular diabetes screenings. This checks your fasting blood sugar level. Have the screening done:  Once every three years after age 40 if you are at a normal weight and have a low risk for diabetes.  More often and at a younger age if you are overweight or have a high risk for diabetes. What should I know about preventing infection? Hepatitis B If you have a higher risk for hepatitis B, you should be screened for this virus. Talk with your health care provider to find out if you are at risk for hepatitis B infection. Hepatitis C Testing is recommended for:  Everyone born from 1945 through 1965.  Anyone with known risk factors for hepatitis C. Sexually transmitted infections (STIs)  Get screened for STIs, including gonorrhea and chlamydia, if: ? You are sexually active and are younger than 54 years of age. ? You are older than 54 years of age and your health care provider tells you that you are at risk for this type of infection. ? Your sexual activity has changed since you were last screened, and you are at increased risk for chlamydia or gonorrhea. Ask your health care provider   if you are at risk.  Ask your health care provider about whether you are at high risk for HIV. Your health care provider may recommend a prescription medicine to help prevent HIV infection. If you choose to take medicine to prevent HIV, you should first get tested for HIV. You should then be tested every 3 months for as long as you are taking the medicine. Pregnancy  If you are about to stop having your period (premenopausal) and  you may become pregnant, seek counseling before you get pregnant.  Take 400 to 800 micrograms (mcg) of folic acid every day if you become pregnant.  Ask for birth control (contraception) if you want to prevent pregnancy. Osteoporosis and menopause Osteoporosis is a disease in which the bones lose minerals and strength with aging. This can result in bone fractures. If you are 65 years old or older, or if you are at risk for osteoporosis and fractures, ask your health care provider if you should:  Be screened for bone loss.  Take a calcium or vitamin D supplement to lower your risk of fractures.  Be given hormone replacement therapy (HRT) to treat symptoms of menopause. Follow these instructions at home: Lifestyle  Do not use any products that contain nicotine or tobacco, such as cigarettes, e-cigarettes, and chewing tobacco. If you need help quitting, ask your health care provider.  Do not use street drugs.  Do not share needles.  Ask your health care provider for help if you need support or information about quitting drugs. Alcohol use  Do not drink alcohol if: ? Your health care provider tells you not to drink. ? You are pregnant, may be pregnant, or are planning to become pregnant.  If you drink alcohol: ? Limit how much you use to 0-1 drink a day. ? Limit intake if you are breastfeeding.  Be aware of how much alcohol is in your drink. In the [place] one drink equals one 12 oz bottle of beer (355 mL), one 5 oz glass of wine (148 mL), or one 1 oz glass of hard liquor (44 mL). General instructions  Schedule regular health, dental, and eye exams.  Stay current with your vaccines.  Tell your health care provider if: ? You often feel depressed. ? You have ever been abused or do not feel safe at home. Summary  Adopting a healthy lifestyle and getting preventive care are important in promoting health and wellness.  Follow your health care provider's instructions about healthy  diet, exercising, and getting tested or screened for diseases.  Follow your health care provider's instructions on monitoring your cholesterol and blood pressure. This information is not intended to replace advice given to you by your health care provider. Make sure you discuss any questions you have with your health care provider. Document Revised: 12/21/2017 Document Reviewed: 12/21/2017 Elsevier Patient Education  2021 Elsevier Inc.  

## 2020-04-02 NOTE — Progress Notes (Signed)
I,Yamilka Roman [employer] as a Education administrator for [employer], FNP.,have documented all relevant documentation on the behalf of Cassandra Geissinger, FNP,as directed by  [name], FNP while in the presence of [name], [place]. This visit occurred during the SARS-CoV-2 public health emergency.  Safety protocols were in place, including screening questions prior to the visit, additional usage of staff PPE, and extensive cleaning of exam room while observing appropriate contact time as indicated for disinfecting solutions.  Subjective:     Patient ID: Cassandra Torres , female    DOB: 1965-03-24 , 55 y.o.   MRN: [id number]   Chief Complaint  Patient presents with  . Annual Exam    HPI  Patient here for hm. She is followed by Dr. [name], she had radioactive iodine in February. She had breast cancer and was treated with Radiation only completed in June 2021.    Wt Readings from Last 3 Encounters: 04/02/20 : 203 lb 9.6 oz (92.4 kg) 12/05/19 : 199 lb 11.2 oz (90.6 kg) 08/09/19 : 192 lb 9.6 oz (87.4 kg)    Past Medical History:  Diagnosis Date  . Cancer ([place]) 02/2019   right breast IDC  . Family history of brain cancer   . Family history of breast cancer   . Family history of prostate cancer   . Personal history of radiation therapy   . PONV (postoperative nausea and vomiting)   . Seasonal allergies   . Thyrotoxicosis      Family History  Problem Relation Age of Onset  . Hypertension Mother   . Healthy Father   . Prostate cancer Maternal Grandfather        dx 28s  . Breast cancer Paternal Aunt        dx 52s  . Brain cancer Maternal Grandmother        d. 30     Current Outpatient Medications:  .  cetirizine (ZYRTEC) 10 MG tablet, 1 tablet, Disp: , Rfl:  .  Cholecalciferol (VITAMIN D) 50 MCG (2000 UT) tablet, 1 tablet, Disp: , Rfl:  .  diphenhydrAMINE (BENADRYL) 25 mg capsule, Take 25 mg by mouth as needed., Disp: , Rfl:  .  loratadine (CLARITIN) 10 MG tablet, Take by mouth., Disp: ,  Rfl:  .  Multiple Vitamins-Minerals (MULTIVITAMIN ADULT EXTRA C PO), See admin instructions., Disp: , Rfl:  .  tamoxifen (NOLVADEX) 20 MG tablet, Take 1 tablet (20 mg total) by mouth daily., Disp: 90 tablet, Rfl: 3 .  Zoster Vaccine Adjuvanted [facility]) injection, Inject 0.5 mLs into the muscle once for 1 dose., Disp: 0.5 mL, Rfl: 0   Allergies  Allergen Reactions  . Elemental Sulfur Hives  . Levaquin [Levofloxacin] Hives  . Penicillins Hives      The patient states she uses none for birth control.  No menstrual cycle since 2021.  She is currently on Tamoxifen.  Negative for Dysmenorrhea and Negative for Menorrhagia. Negative for: breast discharge, breast lump(s), breast pain and breast self exam. Associated symptoms include abnormal vaginal bleeding. Pertinent negatives include abnormal bleeding (hematology), anxiety, decreased libido, depression, difficulty falling sleep, dyspareunia, history of infertility, nocturia, sexual dysfunction, sleep disturbances, urinary incontinence, urinary urgency, vaginal discharge and vaginal itching. Diet regular; will try to eat more salads.The patient states her exercise level is none.   The patient's tobacco use is:  Social History   Tobacco Use  Smoking Status Never Smoker  Smokeless Tobacco Never Used   She has been exposed to passive smoke. The patient's alcohol  use is:  Social History   Substance and Sexual Activity  Alcohol Use Yes   Comment: occasional   Additional information: Last pap 2021, next one scheduled for 2024.  Marland Kitchen    Review of Systems  Constitutional: Negative.   HENT: Negative.   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  Respiratory: Negative.   Cardiovascular: Negative.   Gastrointestinal: Negative.   Endocrine: Negative.   Genitourinary: Negative.   Musculoskeletal: Negative.   Skin: Negative.   Allergic/Immunologic: Negative.   Neurological: Negative.    Hematological: Negative.   Psychiatric/Behavioral: Negative.      Today's Vitals   04/02/20 0840  BP: 128/80  Pulse: 95  Temp: 98.1 F (36.7 C)  TempSrc: Oral  Weight: 203 lb 9.6 oz (92.4 kg)  Height: 5' 8.5" (1.74 m)  PainSc: 0-No pain   Body mass index is 30.51 kg/m.   Objective:  Physical Exam Constitutional:      General: She is not in acute distress.    Appearance: Normal appearance. She is well-developed. She is obese.  HENT:     Head: Normocephalic and atraumatic.     Right Ear: Hearing, tympanic membrane, ear canal and external ear normal. There is no impacted cerumen.     Left Ear: Hearing, tympanic membrane, ear canal and external ear normal. There is no impacted cerumen.     Nose:     Comments: Deferred - masked    Mouth/Throat:     Comments: Deferred - masked Eyes:     General: Lids are normal.     Extraocular Movements: Extraocular movements intact.     Conjunctiva/sclera: Conjunctivae normal.     Pupils: Pupils are equal, round, and reactive to light.     Funduscopic exam:    Right eye: No papilledema.        Left eye: No papilledema.  Neck:     Thyroid: No thyroid mass.     Vascular: No carotid bruit.     Comments: She has neck fullness Cardiovascular:     Rate and Rhythm: Normal rate and regular rhythm.     Pulses: Normal pulses.     Heart sounds: Normal heart sounds. No murmur heard.   Pulmonary:     Effort: Pulmonary effort is normal. No respiratory distress.     Breath sounds: Normal breath sounds. No wheezing.  Chest:     Chest wall: No mass.  Breasts:     Tanner Score is 5.     Right: Normal. No mass, tenderness, axillary adenopathy or supraclavicular adenopathy.     Left: Normal. No mass, tenderness, axillary adenopathy or supraclavicular adenopathy.    Abdominal:     General: Abdomen is flat. Bowel sounds are normal. There is no distension.     Palpations: Abdomen is soft. There is no mass.     Tenderness: There is no abdominal  tenderness.  Genitourinary:    Rectum: Guaiac result negative.  Musculoskeletal:        General: No swelling. Normal range of motion.     Cervical back: Full passive range of motion without pain, normal range of motion and neck supple.     Right lower leg: No edema.     Left lower leg: No edema.  Lymphadenopathy:     Upper Body:     Right upper body: No supraclavicular, axillary or pectoral adenopathy.     Left upper body: No supraclavicular, axillary or pectoral  adenopathy.  Skin:    General: Skin is warm and dry.     Capillary Refill: Capillary refill takes less than 2 seconds.  Neurological:     General: No focal deficit present.     Mental Status: She is alert and oriented to person, place, and time.     Cranial Nerves: No cranial nerve deficit.     Sensory: No sensory deficit.  Psychiatric:        Mood and Affect: Mood normal.        Behavior: Behavior normal.        Thought Content: Thought content normal.        Judgment: Judgment normal.         Assessment And Plan:     1. Encounter for general adult medical examination w/o abnormal findings . Behavior modifications discussed and diet history reviewed.   . Pt will continue to exercise regularly and modify diet with low GI, plant based foods and decrease intake of processed foods.  . Recommend intake of daily multivitamin, Vitamin D (at least 2,000 mcg daily), and calcium.  . Recommend mammogram and colonoscopy (up to date) for preventive screenings, as well as recommend immunizations that include influenza, TDAP, and Shingles  (Rx sent to pharmacy advised to avoid getting if under stress) - CMP14+EGFR - CBC  2. Postablative hypothyroidism  She has recently had radioactive iodine therapy  Continue follow up with Dr. [name] - CMP14+EGFR - VITAMIN D 25 Hydroxy (Vit-D Deficiency, Fractures)  3. Elevated cholesterol  Chronic, controlled  No current medications - Lipid panel  4. Encounter for hepatitis C  screening test for low risk patient  Will check Hepatitis C screening due to recent recommendations to screen all adults 18 years and older - Hepatitis C antibody  5. Malignant neoplasm of lower-outer quadrant of right breast of female, estrogen receptor positive ([place])  She is now taking Tamoxifen  Continue follow up with Oncology  6. Encounter for immunization  Encouraged to get shingles vaccine when she is not feeling ill or under stress - Zoster Vaccine Adjuvanted [facility]) injection; Inject 0.5 mLs into the muscle once for 1 dose.  Dispense: 0.5 mL; Refill: 0     Patient was given opportunity to ask questions. Patient verbalized understanding of the plan and was able to repeat key elements of the plan. All questions were answered to their satisfaction.   [name], FNP   I, [name], FNP, have reviewed all documentation for this visit. The documentation on 04/02/20 for the exam, diagnosis, procedures, and orders are all accurate and complete.   THE PATIENT IS ENCOURAGED TO PRACTICE SOCIAL DISTANCING DUE TO THE COVID-19 PANDEMIC.

## 2020-04-03 LAB — CMP14+EGFR
ALT: 19 IU/L (ref 0–32)
AST: 19 IU/L (ref 0–40)
Albumin/Globulin Ratio: 1.4 (ref 1.2–2.2)
Albumin: 4.2 g/dL (ref 3.8–4.9)
Alkaline Phosphatase: 90 IU/L (ref 44–121)
BUN/Creatinine Ratio: 12 (ref 9–23)
BUN: 10 mg/dL (ref 6–24)
Bilirubin Total: 0.3 mg/dL (ref 0.0–1.2)
CO2: 21 mmol/L (ref 20–29)
Calcium: 9.2 mg/dL (ref 8.7–10.2)
Chloride: 103 mmol/L (ref 96–106)
Creatinine, Ser: 0.82 mg/dL (ref 0.57–1.00)
Globulin, Total: 2.9 g/dL (ref 1.5–4.5)
Glucose: 87 mg/dL (ref 65–99)
Potassium: 4.2 mmol/L (ref 3.5–5.2)
Sodium: 142 mmol/L (ref 134–144)
Total Protein: 7.1 g/dL (ref 6.0–8.5)
eGFR: 85 mL/min/{1.73_m2} (ref >59)

## 2020-04-03 LAB — CBC
Hematocrit: 40.1 % (ref 34.0–46.6)
Hemoglobin: 13.3 g/dL (ref 11.1–15.9)
MCH: 29 pg (ref 26.6–33.0)
MCHC: 33.2 g/dL (ref 31.5–35.7)
MCV: 87 fL (ref 79–97)
Platelets: 212 10*3/uL (ref 150–450)
RBC: 4.59 x10E6/uL (ref 3.77–5.28)
RDW: 12.6 % (ref 11.7–15.4)
WBC: 6.2 10*3/uL (ref 3.4–10.8)

## 2020-04-03 LAB — VITAMIN D 25 HYDROXY (VIT D DEFICIENCY, FRACTURES): Vit D, 25-Hydroxy: 33 ng/mL (ref 30.0–100.0)

## 2020-04-03 LAB — HEPATITIS C ANTIBODY: Hep C Virus Ab: 0.1 s/co ratio (ref 0.0–0.9)

## 2020-05-22 ENCOUNTER — Other Ambulatory Visit: Payer: Self-pay

## 2020-05-22 ENCOUNTER — Inpatient Hospital Stay: Payer: 59 | Attending: Hematology and Oncology

## 2020-05-22 DIAGNOSIS — C170 Malignant neoplasm of duodenum: Secondary | ICD-10-CM | POA: Diagnosis not present

## 2020-05-22 DIAGNOSIS — C50511 Malignant neoplasm of lower-outer quadrant of right female breast: Secondary | ICD-10-CM | POA: Diagnosis not present

## 2020-05-22 DIAGNOSIS — Z808 Family history of malignant neoplasm of other organs or systems: Secondary | ICD-10-CM | POA: Insufficient documentation

## 2020-05-22 DIAGNOSIS — Z803 Family history of malignant neoplasm of breast: Secondary | ICD-10-CM | POA: Insufficient documentation

## 2020-05-22 DIAGNOSIS — Z8042 Family history of malignant neoplasm of prostate: Secondary | ICD-10-CM | POA: Diagnosis not present

## 2020-05-22 DIAGNOSIS — Z79899 Other long term (current) drug therapy: Secondary | ICD-10-CM | POA: Diagnosis not present

## 2020-05-22 DIAGNOSIS — Z79810 Long term (current) use of selective estrogen receptor modulators (SERMs): Secondary | ICD-10-CM | POA: Insufficient documentation

## 2020-05-22 DIAGNOSIS — Z923 Personal history of irradiation: Secondary | ICD-10-CM | POA: Insufficient documentation

## 2020-05-23 LAB — LUTEINIZING HORMONE: LH: 10.1 m[IU]/mL

## 2020-05-23 LAB — FOLLICLE STIMULATING HORMONE: FSH: 25.3 m[IU]/mL

## 2020-06-04 LAB — ESTRADIOL, ULTRA SENS: Estradiol, Sensitive: 13.9 pg/mL

## 2020-06-04 NOTE — Progress Notes (Signed)
Patient Care Team: Cassandra Chard, MD as PCP - General (Internal Medicine) Cassandra Bookbinder, MD as Consulting Physician (General Surgery) Cassandra Lose, MD as Consulting Physician (Hematology and Oncology) Cassandra Pray, MD as Consulting Physician (Radiation Oncology)  DIAGNOSIS:    ICD-10-CM   1. Malignant neoplasm of lower-outer quadrant of right breast of female, estrogen receptor positive ([place])  C50.511    Z17.0     SUMMARY OF ONCOLOGIC HISTORY: Oncology History  Malignant neoplasm of lower-outer quadrant of right breast of female, estrogen receptor positive ([place])  03/02/2019 Initial Diagnosis   Screening mammogram showed right breast masses. Diagnostic mammogram and US showed a 1.2cm mass at the 7 o'clock position 7cm from the nipple, and a 1.1cm mass at the 7 o'clock position 6cm from the nipple, spanning 2.3cm in total, with no evidence of right axillary adenopathy. Biopsy showed IDC, grade 3, HER-2 equivocal by IHC, negative by FISH, ER+ 90%, PR+ 20%, Ki67 2%.    03/17/2019 Genetic Testing   Negative genetic testing. No pathogenic variants identified on the [facility] Breast Cancer STAT Panel + Common Hereditary Cancers Panel + Brain Cancer Panel. VUS in KIT called [id number] identified. The report date is 03/17/2019.  The Breast Cancer STAT Panel + Common Hereditary Cancers Panel + Brain Cancer Panel offered by [facility] includes sequencing and/or deletion duplication testing of the following 70 genes: AIP, ALK, APC*, ATM*, AXIN2, BAP1, BARD1, BMPR1A, BRCA1, BRCA2, BRIP1, CDH1, CDK4, CDKN2A (p14ARF), CDKN2A (p16INK4a), CHEK2, CTNNA1, DICER1*, EPCAM*, EZH2*, GPC3*, GREM1*, HOXB13, HRAS, KIF1B*, KIT, LZTR1, MAX*, MEN1*, MLH1*, MSH2*, MSH3*, MSH6*, MUTYH, NBN, NF1*, NF2, NTHL1, PALB2, PDGFRA, PHOX2B*, PMS2*, POLD1*, POLE, POT1, PRKAR1A, PTCH1, PTCH2, PTEN*, RAD50, RAD51C, RAD51D, RB1*, RET, RNF43, SDHA*, SDHAF2, SDHB, SDHC*, SDHD, SMAD4, SMARCA4, SMARCB1, SMARCE1, STK11, SUFU, TMEM127, TP53,  TSC1*, TSC2, VHL.   03/21/2019 Surgery   Right lumpectomy Cassandra Torres) [phone]): IDC, 2.5cm, grade 2, 4 right axillary lymph nodes negative for carcinoma. Carcinoma focally involved posterior and medial margins and was present <1 mm from the lateral margin.   03/30/2019 Cancer Staging   Staging form: Breast, AJCC 8th Edition - Pathologic stage from 03/30/2019: Stage IA (pT2, pN0, cM0, G2, ER+, PR+, HER2-)   03/30/2019 Oncotype testing   The Oncotype DX score was 14 predicting a risk of outside the breast recurrence over the next 9 years of 4% if the patient's only systemic therapy is tamoxifen for 5 years.    04/30/2019 Surgery   Re-excision of positive margin Cassandra Torres) [phone]): remaining invasive ductal carcinoma, 0.6cm, grade 2.   06/14/2019 - 07/11/2019 Radiation Therapy   The patient initially received a dose of 40.05 Gy in 15 fractions to the breast using whole-breast tangent fields. This was delivered using a 3-D conformal technique. The pt received a boost delivering an additional 12 Gy in 6 fractions using a electron boost with 56mV electrons. The total dose was 52.05 Gy.   08/2019 -  Anti-estrogen oral therapy   Tamoxifen, to be switched to Anastrozole once she is menopausal     CHIEF COMPLIANT: Follow-up of right breast cancer on tamoxifen  INTERVAL HISTORY: TPernell Dikesis a 55y.o. with above-mentioned history of right breast cancer who underwent a right lumpectomy followed by re-excision of a positive margin, radiation, and is currently on tamoxifen. Mammogram on 02/08/20 showed no evidence of malignancy bilaterally. She presents to the clinic todayfor follow-up.  She is tolerating tamoxifen extremely well without any problems or concerns.  Denies any lumps or nodules in the breast.  ALLERGIES:  is allergic to elemental sulfur, levaquin [levofloxacin], and penicillins.  MEDICATIONS:  Current Outpatient Medications  Medication Sig Dispense Refill  . cetirizine  (ZYRTEC) 10 MG tablet 1 tablet    . Cholecalciferol (VITAMIN D) 50 MCG (2000 UT) tablet 1 tablet    . diphenhydrAMINE (BENADRYL) 25 mg capsule Take 25 mg by mouth as needed.    . loratadine (CLARITIN) 10 MG tablet Take by mouth.    . Multiple Vitamins-Minerals (MULTIVITAMIN ADULT EXTRA C PO) See admin instructions.    . tamoxifen (NOLVADEX) 20 MG tablet Take 1 tablet (20 mg total) by mouth daily. 90 tablet 3   No current facility-administered medications for this visit.    PHYSICAL EXAMINATION: ECOG PERFORMANCE STATUS: 1 - Symptomatic but completely ambulatory  Vitals:   06/05/20 1530  BP: 118/78  Pulse: 98  Resp: 18  Temp: 97.7 F (36.5 C)   Filed Weights   06/05/20 1530  Weight: 207 lb 11.2 oz (94.2 kg)    BREAST: No palpable masses or nodules in either right or left breasts. No palpable axillary supraclavicular or infraclavicular adenopathy no breast tenderness or nipple discharge. (exam performed in the presence of a chaperone)  LABORATORY DATA:  I have reviewed the data as listed CMP Latest Ref Rng & Units 04/02/2020 03/07/2019 02/12/2019  Glucose 65 - 99 mg/dL 87 90 79  BUN 6 - 24 mg/dL _0 Creatinine 0.57 - 1.00 mg/dL 0.82 0.82 0.84  Sodium 134 - 144 mmol/L 142 141 138  Potassium 3.5 - 5.2 mmol/L 4.2 4.0 4.2  Chloride 96 - 106 mmol/L 103 105 105  CO2 20 - 29 mmol/L _1 Calcium 8.7 - 10.2 mg/dL 9.2 9.3 9.0  Total Protein 6.0 - 8.5 g/dL 7.1 8.0 7.0  Total Bilirubin 0.0 - 1.2 mg/dL 0.3 0.2(L) 0.3  Alkaline Phos 44 - 121 IU/L 90 97 99  AST 0 - 40 IU/L _2 ALT 0 - 32 IU/L _3 Lab Results  Component Value Date   WBC 6.2 04/02/2020   HGB 13.3 04/02/2020   HCT 40.1 04/02/2020   MCV 87 04/02/2020   PLT 212 04/02/2020   NEUTROABS 5.3 03/07/2019    ASSESSMENT & PLAN:  Malignant neoplasm of lower-outer quadrant of right breast of female, estrogen receptor positive ([place]) 03/21/2019:Right lumpectomy Cassandra Torres): IDC, 2.5cm, grade 2, 4 right  axillary lymph nodes negative for carcinoma.ER 90%, PR 20%, Ki-67 2%, HER-2 negative T2N0 stage Ia Genetic testing: Negative 04/30/2019: Margin reexcision: Residual IDC 0.6 cm grade 2 Oncotype DX score: 14: Risk of recurrence: 4% Adjuvant radiation 06/14/2019-07/11/2019  Recommendation: Adjuvant antiestrogen therapy with tamoxifen 20 mg daily to be switched to anastrozole once she is menopausal  Tamoxifen Toxicities: Denies any adverse effects with tamoxifen.  Denies any hot flashes or arthralgias or myalgias.  Breast cancer surveillance: 1.  Breast exam 06/05/2020: Benign 2. mammogram and ultrasound left breast 02/08/2020: Benign postsurgical changes, left breast simple cyst at 3 o'clock position, density category B  Return to clinic in 1 year for follow-up     No orders of the defined types were placed in this encounter.  The patient has a good understanding of the overall plan. she agrees with it. she will call with any problems that may develop before the next visit here.  Total time spent: 20 mins including face to face time and time spent for planning, charting and coordination of care  Rulon Eisenmenger, MD, MPH  06/05/2020  I, Molly Dorshimer, am acting as scribe for Dr. Nicholas Torres.  I have reviewed the above documentation for accuracy and completeness, and I agree with the above.

## 2020-06-05 ENCOUNTER — Telehealth: Payer: Self-pay | Admitting: Hematology and Oncology

## 2020-06-05 ENCOUNTER — Other Ambulatory Visit: Payer: Self-pay

## 2020-06-05 ENCOUNTER — Inpatient Hospital Stay (HOSPITAL_BASED_OUTPATIENT_CLINIC_OR_DEPARTMENT_OTHER): Payer: 59 | Admitting: Hematology and Oncology

## 2020-06-05 DIAGNOSIS — C50511 Malignant neoplasm of lower-outer quadrant of right female breast: Secondary | ICD-10-CM | POA: Diagnosis not present

## 2020-06-05 DIAGNOSIS — Z170 Estrogen receptor positive status [ER+]: Secondary | ICD-10-CM | POA: Diagnosis not present

## 2020-06-05 MED ORDER — TAMOXIFEN CITRATE 20 MG PO TABS: 20.0000 mg | ORAL_TABLET | Freq: Every day | ORAL | 3 refills | Status: DC

## 2020-06-05 NOTE — Assessment & Plan Note (Signed)
03/21/2019:Right lumpectomy [name]): IDC, 2.5cm, grade 2, 4 right axillary lymph nodes negative for carcinoma.ER 90%, PR 20%, Ki-67 2%, HER-2 negative T2N0 stage Ia Genetic testing: Negative 04/30/2019: Margin reexcision: Residual IDC 0.6 cm grade 2 Oncotype DX score: 14: Risk of recurrence: 4% Adjuvant radiation 06/14/2019-07/11/2019  Recommendation: Adjuvant antiestrogen therapy with tamoxifen 20 mg daily to be switched to anastrozole once she is menopausal  Tamoxifen Toxicities:  Breast cancer surveillance: 1.  Breast exam 06/05/2020: Benign 2. mammogram and ultrasound left breast 02/08/2020: Benign postsurgical changes, left breast simple cyst at 3 o'clock position, density category B  Return to clinic in 1 year for follow-up

## 2020-06-05 NOTE — Telephone Encounter (Signed)
Scheduled appointment per 05/26 los. Patient is aware. 

## 2020-06-11 DIAGNOSIS — Z9109 Other allergy status, other than to drugs and biological substances: Secondary | ICD-10-CM

## 2020-06-11 DIAGNOSIS — B070 Plantar wart: Secondary | ICD-10-CM

## 2020-06-11 HISTORY — DX: Plantar wart: B07.0

## 2020-06-11 HISTORY — DX: Other allergy status, other than to drugs and biological substances: Z91.09

## 2020-09-29 ENCOUNTER — Other Ambulatory Visit: Payer: Self-pay

## 2020-09-29 ENCOUNTER — Ambulatory Visit (INDEPENDENT_AMBULATORY_CARE_PROVIDER_SITE_OTHER): Payer: 59 | Admitting: Internal Medicine

## 2020-09-29 ENCOUNTER — Encounter: Payer: Self-pay | Admitting: Internal Medicine

## 2020-09-29 VITALS — Ht 68.5 in | Wt 200.0 lb

## 2020-09-29 DIAGNOSIS — C50511 Malignant neoplasm of lower-outer quadrant of right female breast: Secondary | ICD-10-CM | POA: Diagnosis not present

## 2020-09-29 DIAGNOSIS — Z170 Estrogen receptor positive status [ER+]: Secondary | ICD-10-CM | POA: Diagnosis not present

## 2020-09-29 DIAGNOSIS — E890 Postprocedural hypothyroidism: Secondary | ICD-10-CM | POA: Diagnosis not present

## 2020-09-29 NOTE — Patient Instructions (Signed)
Exercising to Stay Healthy °To become healthy and stay healthy, it is recommended that you do moderate-intensity and vigorous-intensity exercise. You can tell that you are exercising at a moderate intensity if your heart starts beating faster and you start breathing faster but can still hold a conversation. You can tell that you are exercising at a vigorous intensity if you are breathing much harder and faster and cannot hold a conversation while exercising. °How can exercise benefit me? °Exercising regularly is important. It has many health benefits, such as: °Improving overall fitness, flexibility, and endurance. °Increasing bone density. °Helping with weight control. °Decreasing body fat. °Increasing muscle strength and endurance. °Reducing stress and tension, anxiety, depression, or anger. °Improving overall health. °What guidelines should I follow while exercising? °Before you start a new exercise program, talk with your health care provider. °Do not exercise so much that you hurt yourself, feel dizzy, or get very short of breath. °Wear comfortable clothes and wear shoes with good support. °Drink plenty of water while you exercise to prevent dehydration or heat stroke. °Work out until your breathing and your heartbeat get faster (moderate intensity). °How often should I exercise? °Choose an activity that you enjoy, and set realistic goals. Your health care provider can help you make an activity plan that is individually designed and works best for you. °Exercise regularly as told by your health care provider. This may include: °Doing strength training two times a week, such as: °Lifting weights. °Using resistance bands. °Push-ups. °Sit-ups. °Yoga. °Doing a certain intensity of exercise for a given amount of time. Choose from these options: °A total of 150 minutes of moderate-intensity exercise every week. °A total of 75 minutes of vigorous-intensity exercise every week. °A mix of moderate-intensity and  vigorous-intensity exercise every week. °Children, pregnant women, people who have not exercised regularly, people who are overweight, and older adults may need to talk with a health care provider about what activities are safe to perform. If you have a medical condition, be sure to talk with your health care provider before you start a new exercise program. °What are some exercise ideas? °Moderate-intensity exercise ideas include: °Walking 1 mile (1.6 km) in about 15 minutes. °Biking. °Hiking. °Golfing. °Dancing. °Water aerobics. °Vigorous-intensity exercise ideas include: °Walking 4.5 miles (7.2 km) or more in about 1 hour. °Jogging or running 5 miles (8 km) in about 1 hour. °Biking 10 miles (16.1 km) or more in about 1 hour. °Lap swimming. °Roller-skating or in-line skating. °Cross-country skiing. °Vigorous competitive sports, such as football, basketball, and soccer. °Jumping rope. °Aerobic dancing. °What are some everyday activities that can help me get exercise? °Yard work, such as: °Pushing a lawn mower. °Raking and bagging leaves. °Washing your car. °Pushing a stroller. °Shoveling snow. °Gardening. °Washing windows or floors. °How can I be more active in my day-to-day activities? °Use stairs instead of an elevator. °Take a walk during your lunch break. °If you drive, park your car farther away from your work or school. °If you take public transportation, get off one stop early and walk the rest of the way. °Stand up or walk around during all of your indoor phone calls. °Get up, stretch, and walk around every 30 minutes throughout the day. °Enjoy exercise with a friend. Support to continue exercising will help you keep a regular routine of activity. °Where to find more information °You can find more information about exercising to stay healthy from: °U.S. Department of Health and Human Services: www.hhs.gov °Centers for Disease Control and Prevention (  CDC): www.cdc.gov °Summary °Exercising regularly is  important. It will improve your overall fitness, flexibility, and endurance. °Regular exercise will also improve your overall health. It can help you control your weight, reduce stress, and improve your bone density. °Do not exercise so much that you hurt yourself, feel dizzy, or get very short of breath. °Before you start a new exercise program, talk with your health care provider. °This information is not intended to replace advice given to you by your health care provider. Make sure you discuss any questions you have with your health care provider. °Document Revised: 04/25/2020 Document Reviewed: 04/25/2020 °Elsevier Patient Education © 2022 Elsevier Inc. ° °

## 2020-09-29 NOTE — Progress Notes (Signed)
Virtual Visit via Video   This visit type was conducted due to national recommendations for restrictions regarding the COVID-19 Pandemic (e.g. social distancing) in an effort to limit this patient's exposure and mitigate transmission in our community.  Due to her co-morbid illnesses, this patient is at least at moderate risk for complications without adequate follow up.  This format is felt to be most appropriate for this patient at this time.  All issues noted in this document were discussed and addressed.  A limited physical exam was performed with this format.    This visit type was conducted due to national recommendations for restrictions regarding the COVID-19 Pandemic (e.g. social distancing) in an effort to limit this patient's exposure and mitigate transmission in our community.  Patients identity confirmed using two different identifiers.  This format is felt to be most appropriate for this patient at this time.  All issues noted in this document were discussed and addressed.  No physical exam was performed (except for noted visual exam findings with Video Visits).    Date:  10/16/2020   ID:  Cassandra Torres, DOB [birth year], MRN [id number]  Patient Location:  Work, she is in a room alone  Provider location:   Office    Chief Complaint:  "I need referrals"  History of Present Illness:    Cassandra Torres is a 55 y.o. female who presents via video conferencing for a telehealth visit today.     The patient does not have symptoms concerning for COVID-19 infection (fever, chills, cough, or new shortness of breath).   She prefers virtual visits. She has requested this visit today to get referrals placed. States she needs referral to Dr. [name], her breast surgeon on 10/27, Dr. [name] her endocrinologist on 12/26/20. She also wants to get referral placed for b/l diagnostic mammo for Jan 2023.   She has no other concerns at this time. Does not wish to take time off of work to come to  appts.     Past Medical History:  Diagnosis Date   Cancer ([place]) 02/2019   right breast IDC   Family history of brain cancer    Family history of breast cancer    Family history of prostate cancer    Personal history of radiation therapy    Plantar wart 06/11/2020   PONV (postoperative nausea and vomiting)    Seasonal allergies    Sun allergy 06/11/2020   Thyrotoxicosis    Past Surgical History:  Procedure Laterality Date   BREAST BIOPSY Right 02/27/2019   x2   BREAST LUMPECTOMY Right 03/21/2019   BREAST LUMPECTOMY WITH RADIOACTIVE SEED AND SENTINEL LYMPH NODE BIOPSY Right 03/21/2019   Procedure: RIGHT BREAST LUMPECTOMY WITH BRACKETED RADIOACTIVE SEED AND SENTINEL LYMPH NODE BIOPSY;  Surgeon: Rolm Bookbinder, MD;  Location: [facility];  Service: General;  Laterality: Right;   COLONOSCOPY     COLPOSCOPY     RE-EXCISION OF BREAST CANCER,SUPERIOR MARGINS Right 04/30/2019   Procedure: RE-EXCISION OF RIGHT BREAST MARGINS;  Surgeon: Rolm Bookbinder, MD;  Location: [facility];  Service: General;  Laterality: Right;     Current Meds  Medication Sig   cetirizine (ZYRTEC) 10 MG tablet 1 tablet   Cholecalciferol (VITAMIN D) 50 MCG (2000 UT) tablet 1 tablet   diphenhydrAMINE (BENADRYL) 25 mg capsule Take 25 mg by mouth as needed.   levothyroxine (SYNTHROID) 50 MCG tablet Take 50 mcg by mouth daily.   loratadine (CLARITIN) 10 MG tablet Take by mouth.  Multiple Vitamins-Minerals (MULTIVITAMIN ADULT EXTRA C PO) See admin instructions.   tamoxifen (NOLVADEX) 20 MG tablet Take 1 tablet (20 mg total) by mouth daily.     Allergies:   Elemental sulfur, Levaquin [levofloxacin], and Penicillins   Social History   Tobacco Use   Smoking status: Never   Smokeless tobacco: Never  Vaping Use   Vaping Use: Never used  Substance Use Topics   Alcohol use: Yes    Comment: occasional   Drug use: Never     Family Hx: The patient's family history includes Brain  cancer in her maternal grandmother; Breast cancer in her paternal aunt; Healthy in her father; Hypertension in her mother; Prostate cancer in her maternal grandfather.  ROS:   Please see the history of present illness.    Review of Systems  Constitutional: Negative.   Respiratory: Negative.    Cardiovascular: Negative.   Gastrointestinal: Negative.   Neurological: Negative.   Psychiatric/Behavioral: Negative.     All other systems reviewed and are negative.   Labs/Other Tests and Data Reviewed:    Recent Labs: 04/02/2020: ALT 19; BUN 10; Creatinine, Ser 0.82; Hemoglobin 13.3; Platelets 212; Potassium 4.2; Sodium 142   Recent Lipid Panel Lab Results  Component Value Date/Time   CHOL 196 04/02/2020 09:23 AM   TRIG 69 04/02/2020 09:23 AM   HDL 74 04/02/2020 09:23 AM   CHOLHDL 2.6 04/02/2020 09:23 AM   LDLCALC 109 (H) 04/02/2020 09:23 AM    Wt Readings from Last 3 Encounters:  09/29/20 200 lb (90.7 kg)  06/05/20 207 lb 11.2 oz (94.2 kg)  04/02/20 203 lb 9.6 oz (92.4 kg)     Exam:    Vital Signs:  Ht 5' 8.5" (1.74 m)   Wt 200 lb (90.7 kg)   BMI 29.97 kg/m     Physical Exam Vitals and nursing note reviewed.  HENT:     Head: Normocephalic and atraumatic.  Pulmonary:     Effort: Pulmonary effort is normal.  Musculoskeletal:     Cervical back: Normal range of motion.  Neurological:     Mental Status: She is alert and oriented to person, place, and time.  Psychiatric:        Mood and Affect: Affect normal.    ASSESSMENT & PLAN:     1. Postablative hypothyroidism Comments: I will place referral to Dr. [name] as requested.  - Ambulatory referral to Endocrinology  2. Malignant neoplasm of lower-outer quadrant of right breast of female, estrogen receptor positive ([place]) Comments: I will place referral for general surgeon as requested.  - MM DIAG BREAST TOMO BILATERAL; Future - Ambulatory referral to General Surgery   COVID-19 Education: The signs and symptoms of  COVID-19 were discussed with the patient and how to seek care for testing (follow up with PCP or arrange E-visit).  The importance of social distancing was discussed today.  Patient Risk:   After full review of this patients clinical status, I feel that they are at least moderate risk at this time.  Time:   Today, I have spent 10 minutes/ seconds with the patient with telehealth technology discussing above diagnoses.     Medication Adjustments/Labs and Tests Ordered: Current medicines are reviewed at length with the patient today.  Concerns regarding medicines are outlined above.   Tests Ordered: Orders Placed This Encounter  Procedures   MM DIAG BREAST TOMO BILATERAL   Ambulatory referral to Endocrinology   Ambulatory referral to General Surgery    Medication Changes: No  orders of the defined types were placed in this encounter.   Disposition:  Follow up prn  Signed, Maximino Greenland, MD

## 2021-01-21 IMAGING — US US  BREAST BX W/ LOC DEV 1ST LESION IMG BX SPEC US GUIDE*R*
1 series · 12 of 24 positions shown · non-contrast
Comparison: Previous exam(s).
COMPARISON: Previous exam(s).

Addendum:
CLINICAL DATA: 53-year-old with screening detected adjacent masses
associated with architectural distortion involving the LOWER OUTER
QUADRANT of the RIGHT breast.

EXAM:
ULTRASOUND GUIDED RIGHT BREAST CORE NEEDLE BIOPSY x 2

[Series 1: us breast bx w/ loc dev 1st lesion img bx spec us  · 0.07mm/px · 12 of 24 slices shown]
[im 2/24]
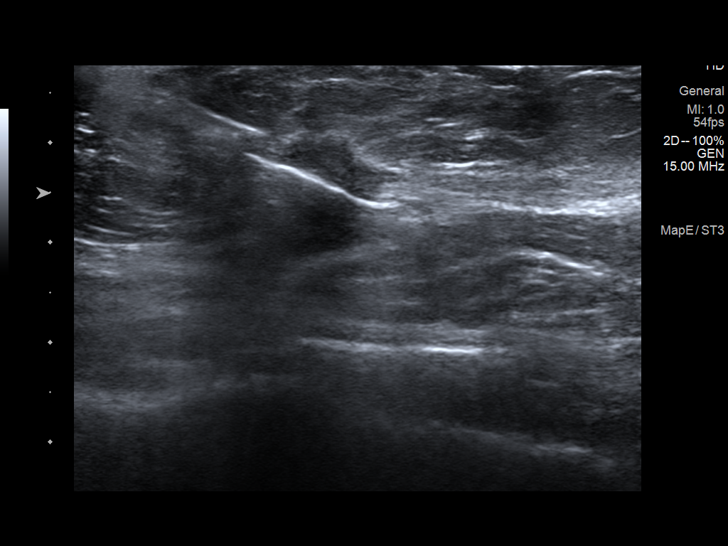
[im 4/24]
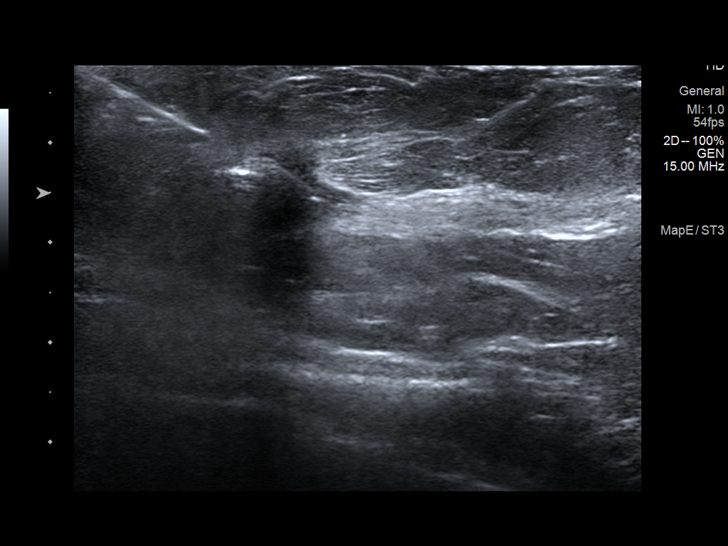
[im 6/24]
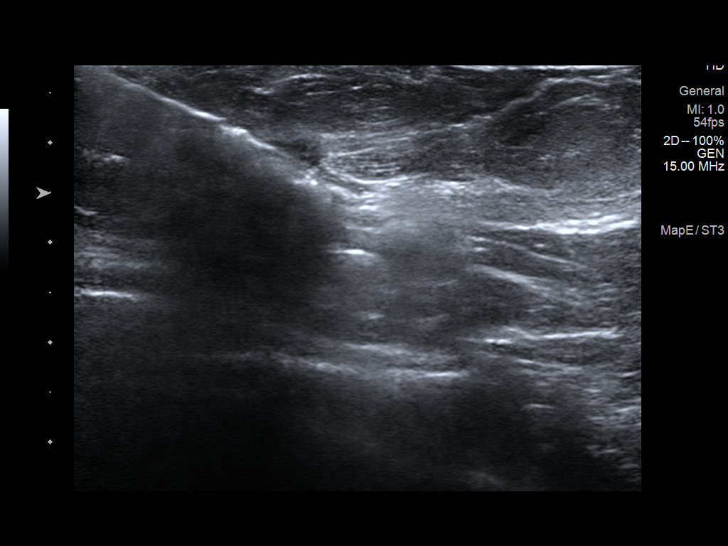
[im 8/24]
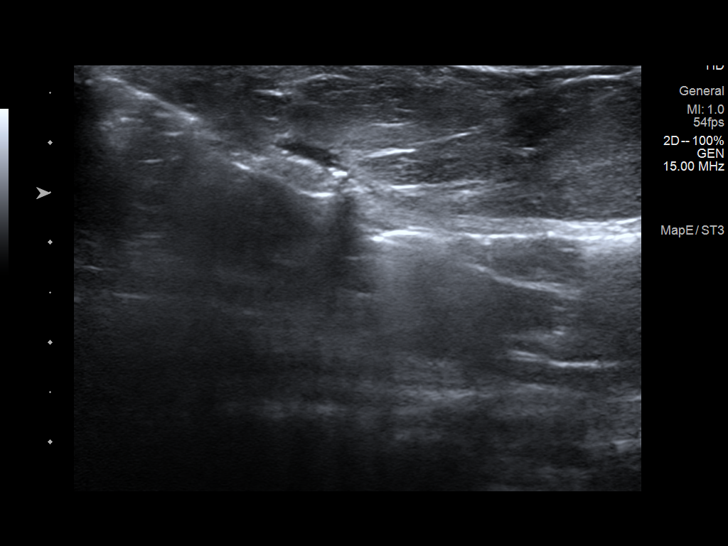
[im 10/24]
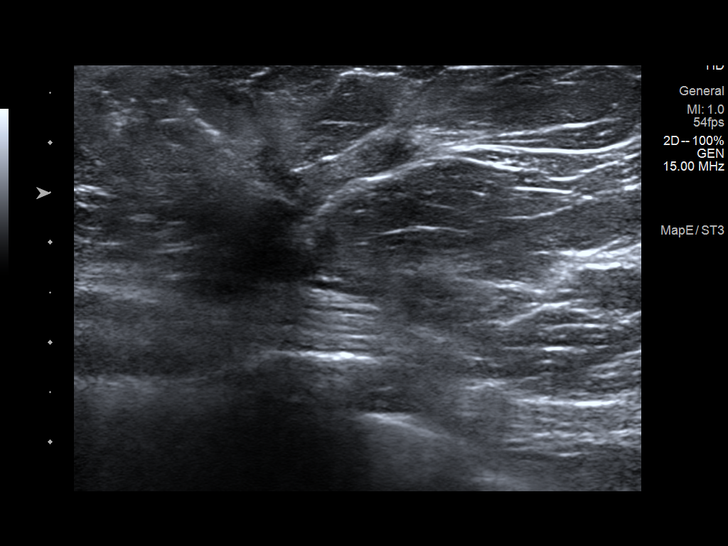
[im 12/24]
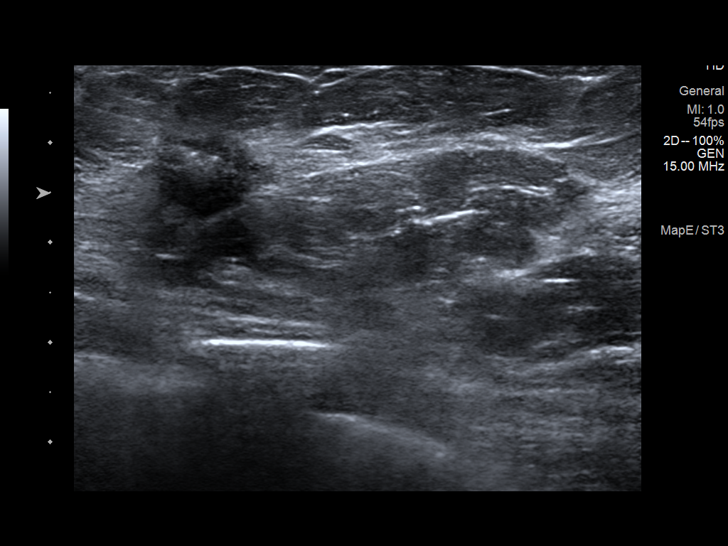
[im 14/24]
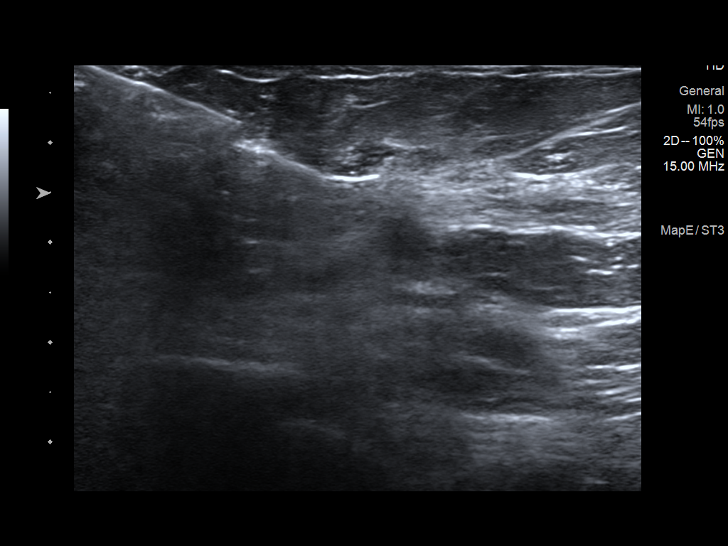
[im 16/24]
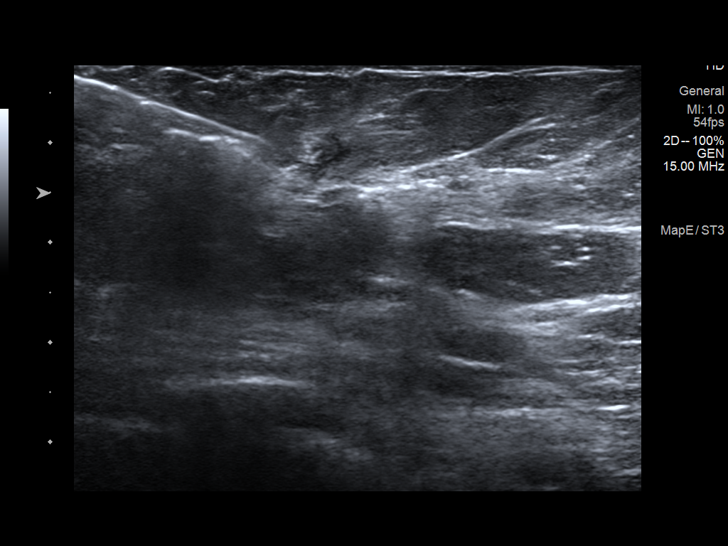
[im 18/24]
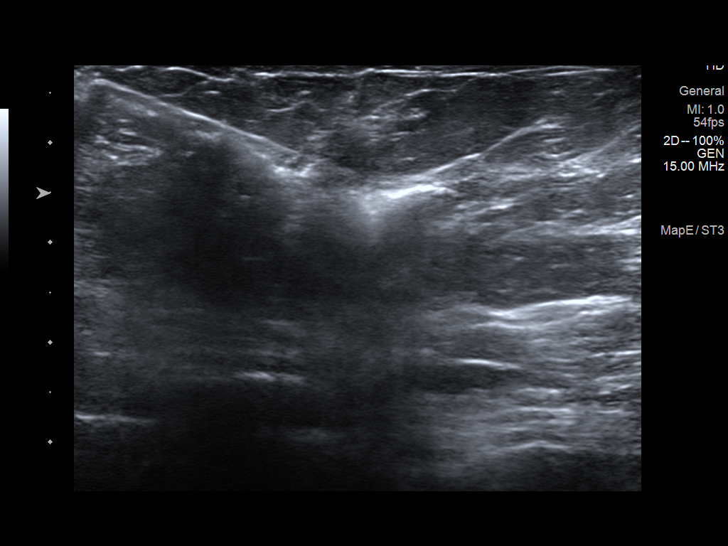
[im 20/24]
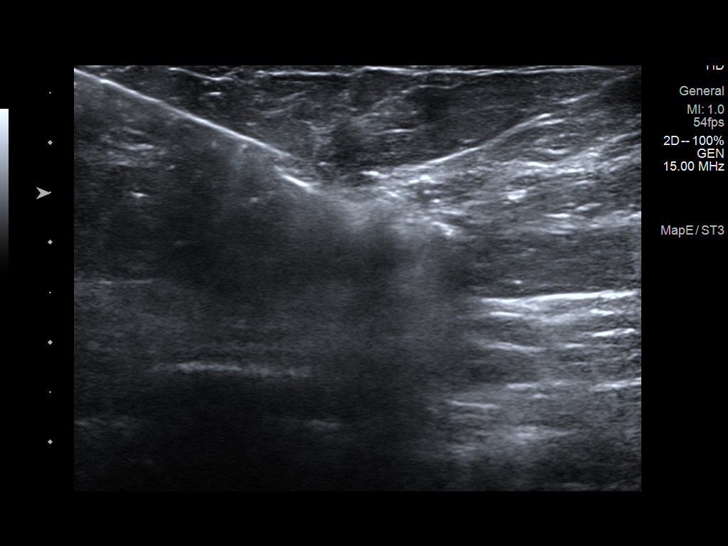
[im 22/24]
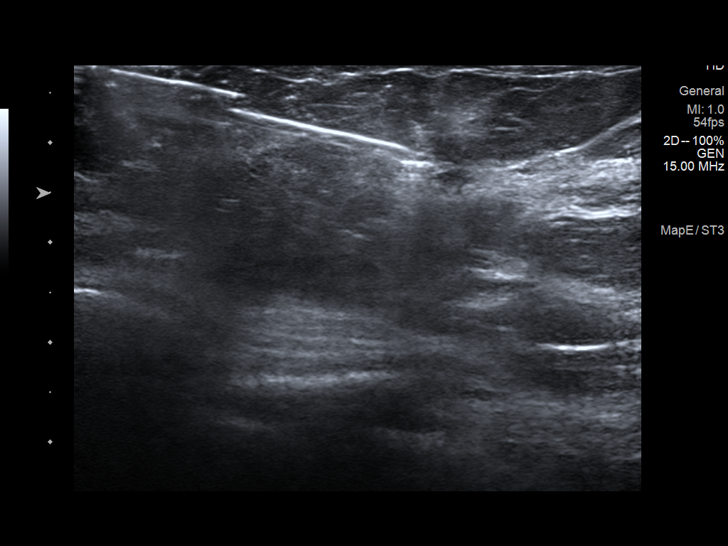
[im 24/24]
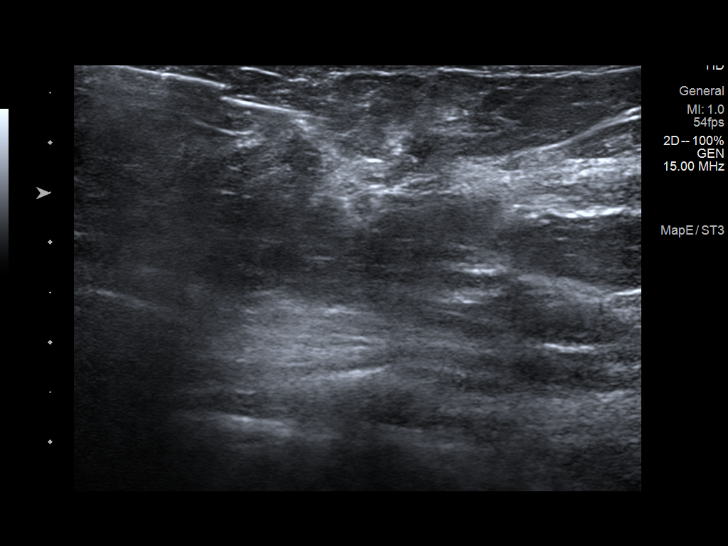

[12 of 24 positions shown; findings below may reference images not displayed]



# 1) 7 o'clock, 7 cm from nipple, lesion quadrant: LOWER OUTER
QUADRANT.

Using sterile technique with chlorhexidine as skin antisepsis, 1%
lidocaine and 1% lidocaine with epi as local anesthetic, under
direct ultrasound visualization, a 12 gauge Gege Downer core needle
device placed through an 11 gauge introducer needle was used to
perform biopsy of the dominant 1.2 cm mass at the 7 o'clock position
approximately 7 cm from nipple using a lateral approach. At the
conclusion of the procedure a ribbon shaped tissue marker clip was
deployed into the biopsy cavity.

# 2) 7 o'clock, 6 cm from nipple, lesion quadrant: LOWER OUTER
QUADRANT.

Using the same dermatotomy site, a 12 gauge Gege Downer core needle
device placed through an 11 gauge introducer needle was used perform
biopsy of the adjacent 1.1 cm mass at the 7 o'clock position
approximately 6 cm from nipple using the same lateral approach. At
the conclusion procedure, a coil shaped tissue marker clip was
deployed into the biopsy cavity.

Follow up 2 view mammogram was performed and dictated separately.
IMPRESSION: Ultrasound guided biopsy of 2 adjacent masses in the LOWER OUTER
QUADRANT of the RIGHT breast. No apparent complications.

ADDENDUM:
Pathology revealed GRADE III INVASIVE DUCTAL CARCINOMA of the Right
breast, lower outer quadrant, 7 o'clock, 9cmfn. This was found to be
concordant by Dr. Lalu Yopi Furqon.

Pathology revealed SMALL FOCUS OF ATYPICAL EPITHELIUM of the Right
breast, lower outer quadrant, 7 o'clock, 2cmfn. There is a focus of
atypical epithelium which is concerning for malignancy. This was
found to be concordant by Dr. Lalu Yopi Furqon, with excision
recommended.

Pathology results were discussed with the patient by telephone by
Relindas Auad, RN Nurse Navigator. The patient reported doing well
after the biopsies with tenderness at the sites. Post biopsy
instructions and care were reviewed and questions were answered. The
patient was encouraged to call [REDACTED] for any additional concerns.

The patient was referred to [REDACTED]
[REDACTED] at [REDACTED] on
March 07, 2019.

Pathology results reported by Cecille Bu, RN on 03/01/2019.



# 1) 7 o'clock, 7 cm from nipple, lesion quadrant: LOWER OUTER
QUADRANT.

Using sterile technique with chlorhexidine as skin antisepsis, 1%
lidocaine and 1% lidocaine with epi as local anesthetic, under
direct ultrasound visualization, a 12 gauge Gege Downer core needle
device placed through an 11 gauge introducer needle was used to
perform biopsy of the dominant 1.2 cm mass at the 7 o'clock position
approximately 7 cm from nipple using a lateral approach. At the
conclusion of the procedure a ribbon shaped tissue marker clip was
deployed into the biopsy cavity.

# 2) 7 o'clock, 6 cm from nipple, lesion quadrant: LOWER OUTER
QUADRANT.

Using the same dermatotomy site, a 12 gauge Gege Downer core needle
device placed through an 11 gauge introducer needle was used perform
biopsy of the adjacent 1.1 cm mass at the 7 o'clock position
approximately 6 cm from nipple using the same lateral approach. At
the conclusion procedure, a coil shaped tissue marker clip was
deployed into the biopsy cavity.

Follow up 2 view mammogram was performed and dictated separately.
IMPRESSION: Ultrasound guided biopsy of 2 adjacent masses in the LOWER OUTER
QUADRANT of the RIGHT breast. No apparent complications.

## 2021-02-09 ENCOUNTER — Other Ambulatory Visit: Payer: Self-pay

## 2021-02-09 ENCOUNTER — Ambulatory Visit
Admission: RE | Admit: 2021-02-09 | Discharge: 2021-02-09 | Disposition: A | Discharge status: Home / Self Care | Payer: BC Managed Care – PPO | Source: Ambulatory Visit | Attending: Internal Medicine | Admitting: Internal Medicine

## 2021-02-09 DIAGNOSIS — R922 Inconclusive mammogram: Secondary | ICD-10-CM | POA: Diagnosis not present

## 2021-02-09 DIAGNOSIS — C50511 Malignant neoplasm of lower-outer quadrant of right female breast: Secondary | ICD-10-CM

## 2021-02-09 DIAGNOSIS — Z853 Personal history of malignant neoplasm of breast: Secondary | ICD-10-CM | POA: Diagnosis not present

## 2021-02-11 IMAGING — MG MM PLC BREAST LOC DEV 1ST LESION INC*R*
7 series · 7 of 7 positions shown · non-contrast
Comparison: Previous exam(s).

CLINICAL DATA: Patient presents for radioactive seed localization 2
lesions, focus of DCIS and an adjacent area atypia, in the right
breast prior to surgical excision.

EXAM:
MAMMOGRAPHIC GUIDED RADIOACTIVE SEED LOCALIZATION OF THE RIGHT
BREAST: 2 LESIONS LOCALIZED

[R CC (1 of 4)]
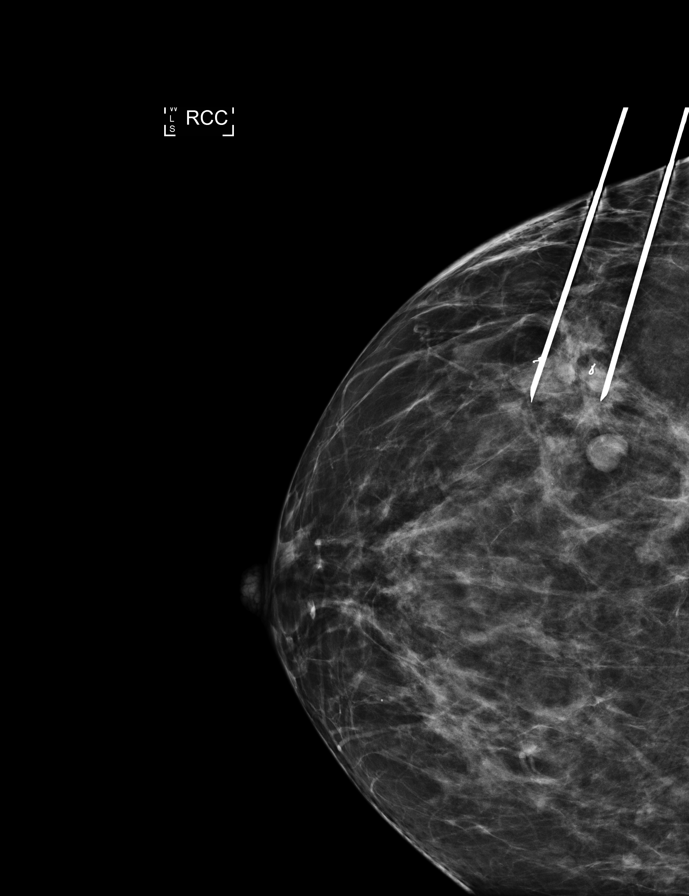

[R LM (1 of 3)]
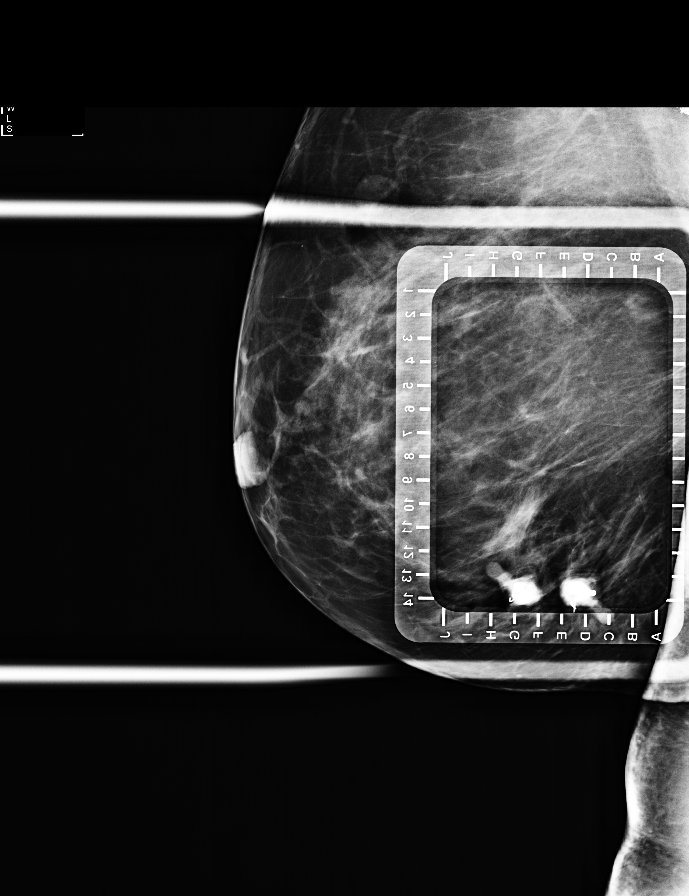

[R CC (2 of 4)]
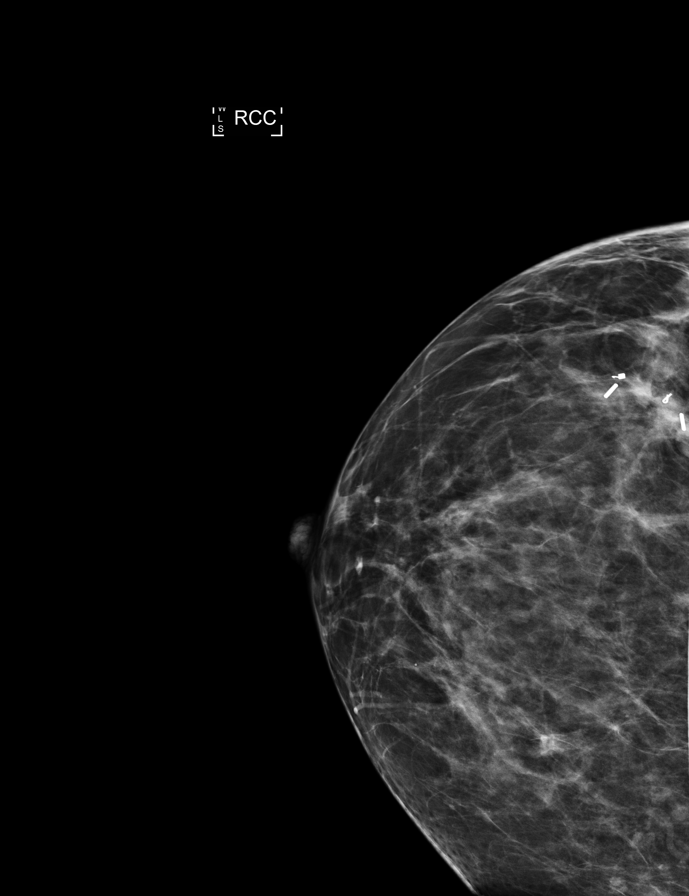

[R CC (3 of 4)]
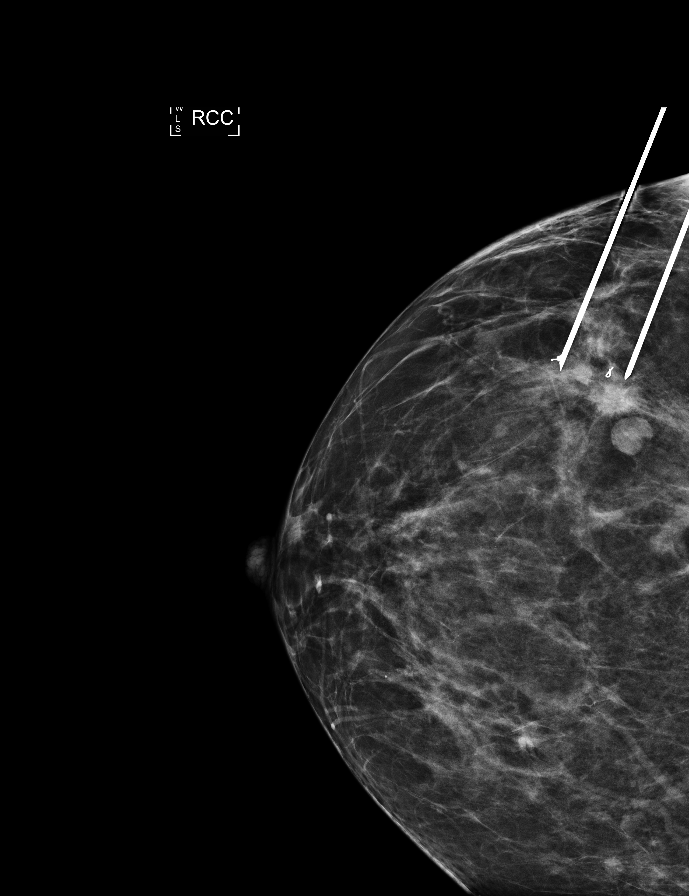

[R LM (2 of 3)]
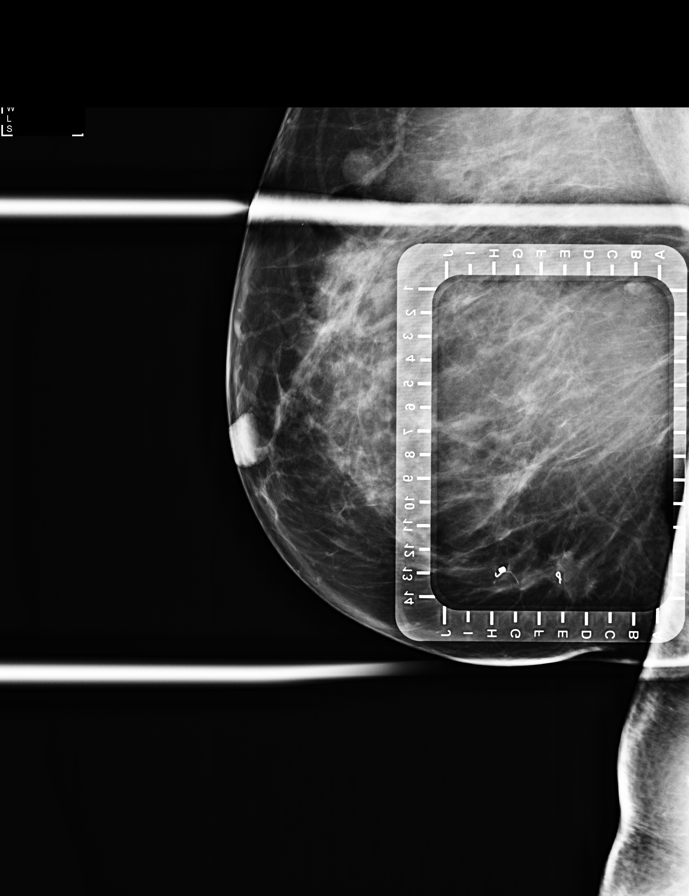

[R CC (4 of 4)]
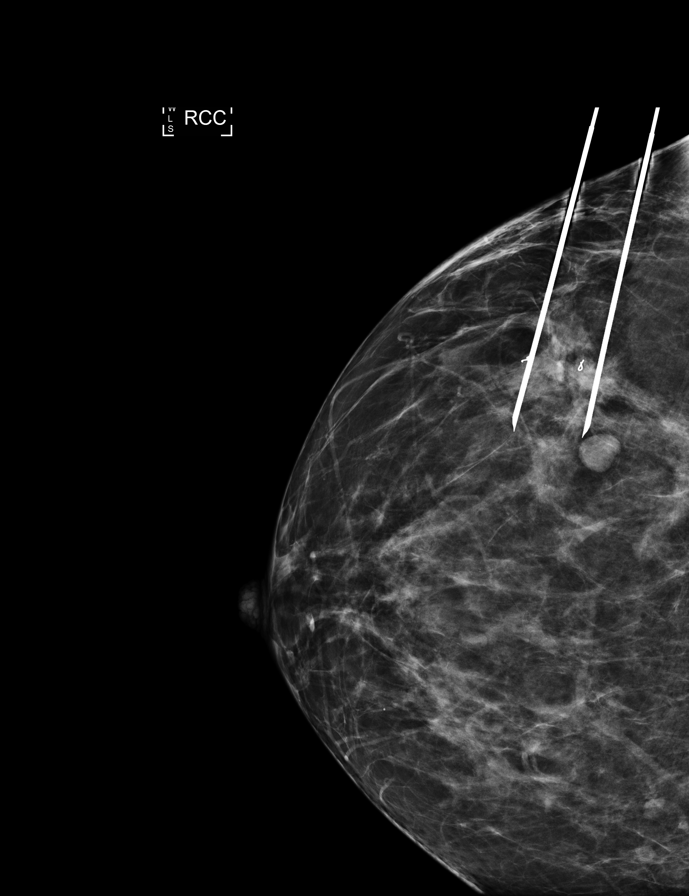

[R LM (3 of 3)]
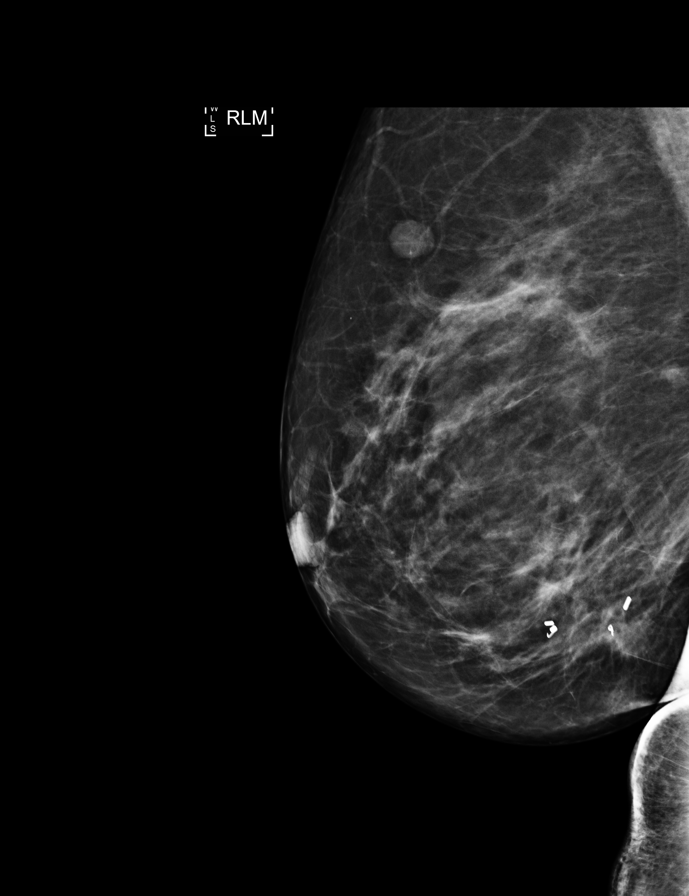

[7 of 7 positions shown; findings below may reference images not displayed]

FINDINGS: Patient presents for radioactive seed localization prior to . I met
with the patient and we discussed the procedure of seed localization
including benefits and alternatives. We discussed the high
likelihood of a successful procedure. We discussed the risks of the
procedure including infection, bleeding, tissue injury and further
surgery. We discussed the low dose of radioactivity involved in the
procedure. Informed, written consent was given.

The usual time-out protocol was performed immediately prior to the
procedure.

Ribbon Clip

Using mammographic guidance, sterile technique, 1% lidocaine and an
D-FW0 radioactive seed, the ribbon shaped biopsy clip was localized
using a lateral approach. The follow-up mammogram images confirm the
seed in the expected location and were marked for Dr. Enis.

Follow-up survey of the patient confirms presence of the radioactive
seed.

Order number of D-FW0 seed:  [id number].

Total activity:  0.242 millicuries reference Date: 03/16/2019

Coil Clip

Using mammographic guidance, sterile technique, 1% lidocaine and an
D-FW0 radioactive seed, the coil shaped biopsy clip was localized
using a lateral approach. The follow-up mammogram images confirm the
seed in the expected location and were marked for Dr. Enis.

Follow-up survey of the patient confirms presence of the radioactive
seed.

Order number of D-FW0 seed:  [id number].

Total activity:  0.214 millicuries reference Date: 03/16/2019

The patient tolerated the procedure well and was released from the
[REDACTED]. She was given instructions regarding seed removal.
IMPRESSION: Radioactive seed localization 2 right breast lesions prior to
surgical excision. No apparent complications.

## 2021-02-12 IMAGING — DX MM BREAST SURGICAL SPECIMEN
1 series · 2 of 2 positions shown · non-contrast
Comparison: Previous exam(s).

CLINICAL DATA: Status post surgical excision of the right breast
following radioactive seed localization of 2 adjacent lesions.

EXAM:
SPECIMEN RADIOGRAPH OF THE RIGHT BREAST

[Series 1: specimen digital x-ray · right · 0.10mm/px · 2 of 2 slices shown]
[im 1/2]
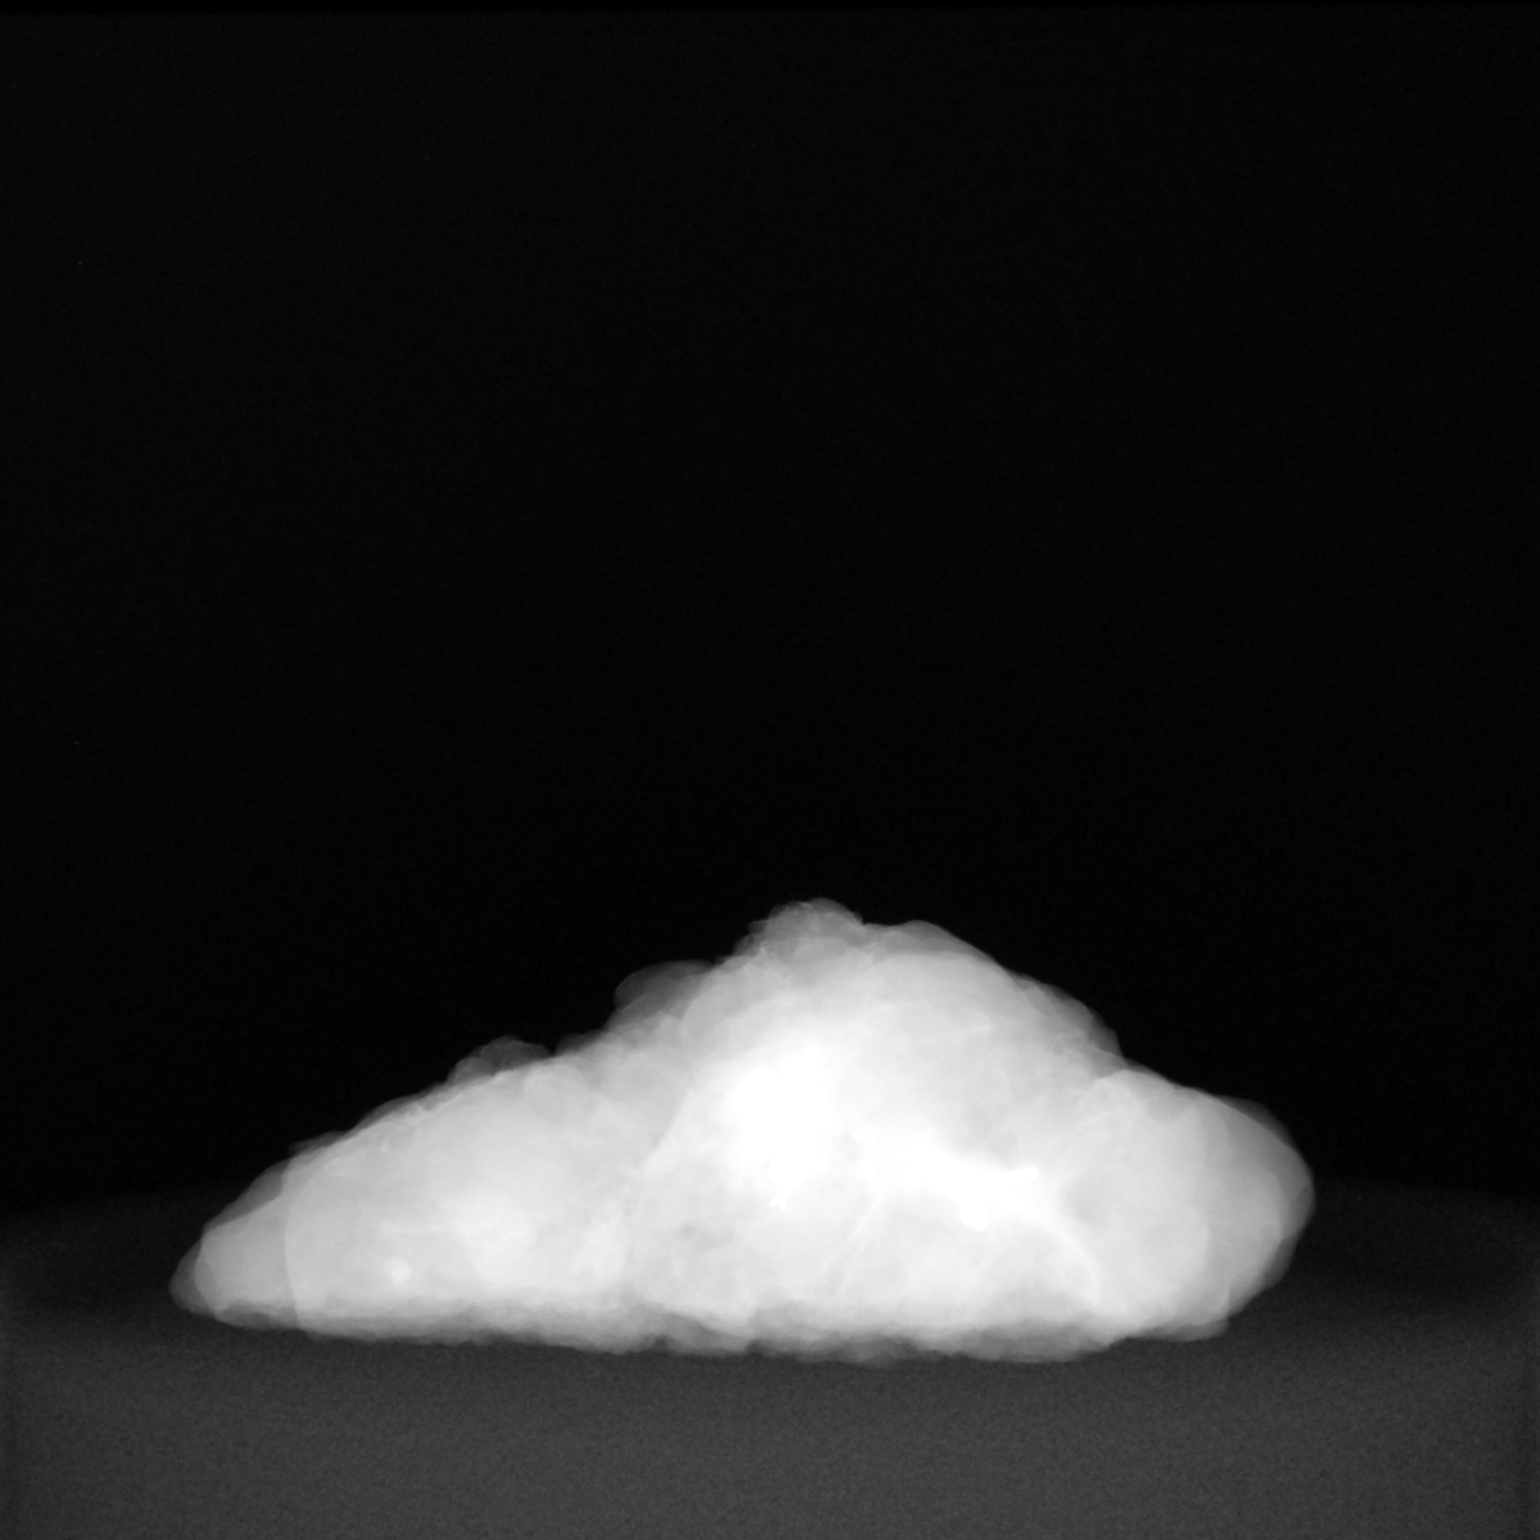
[im 2/2]
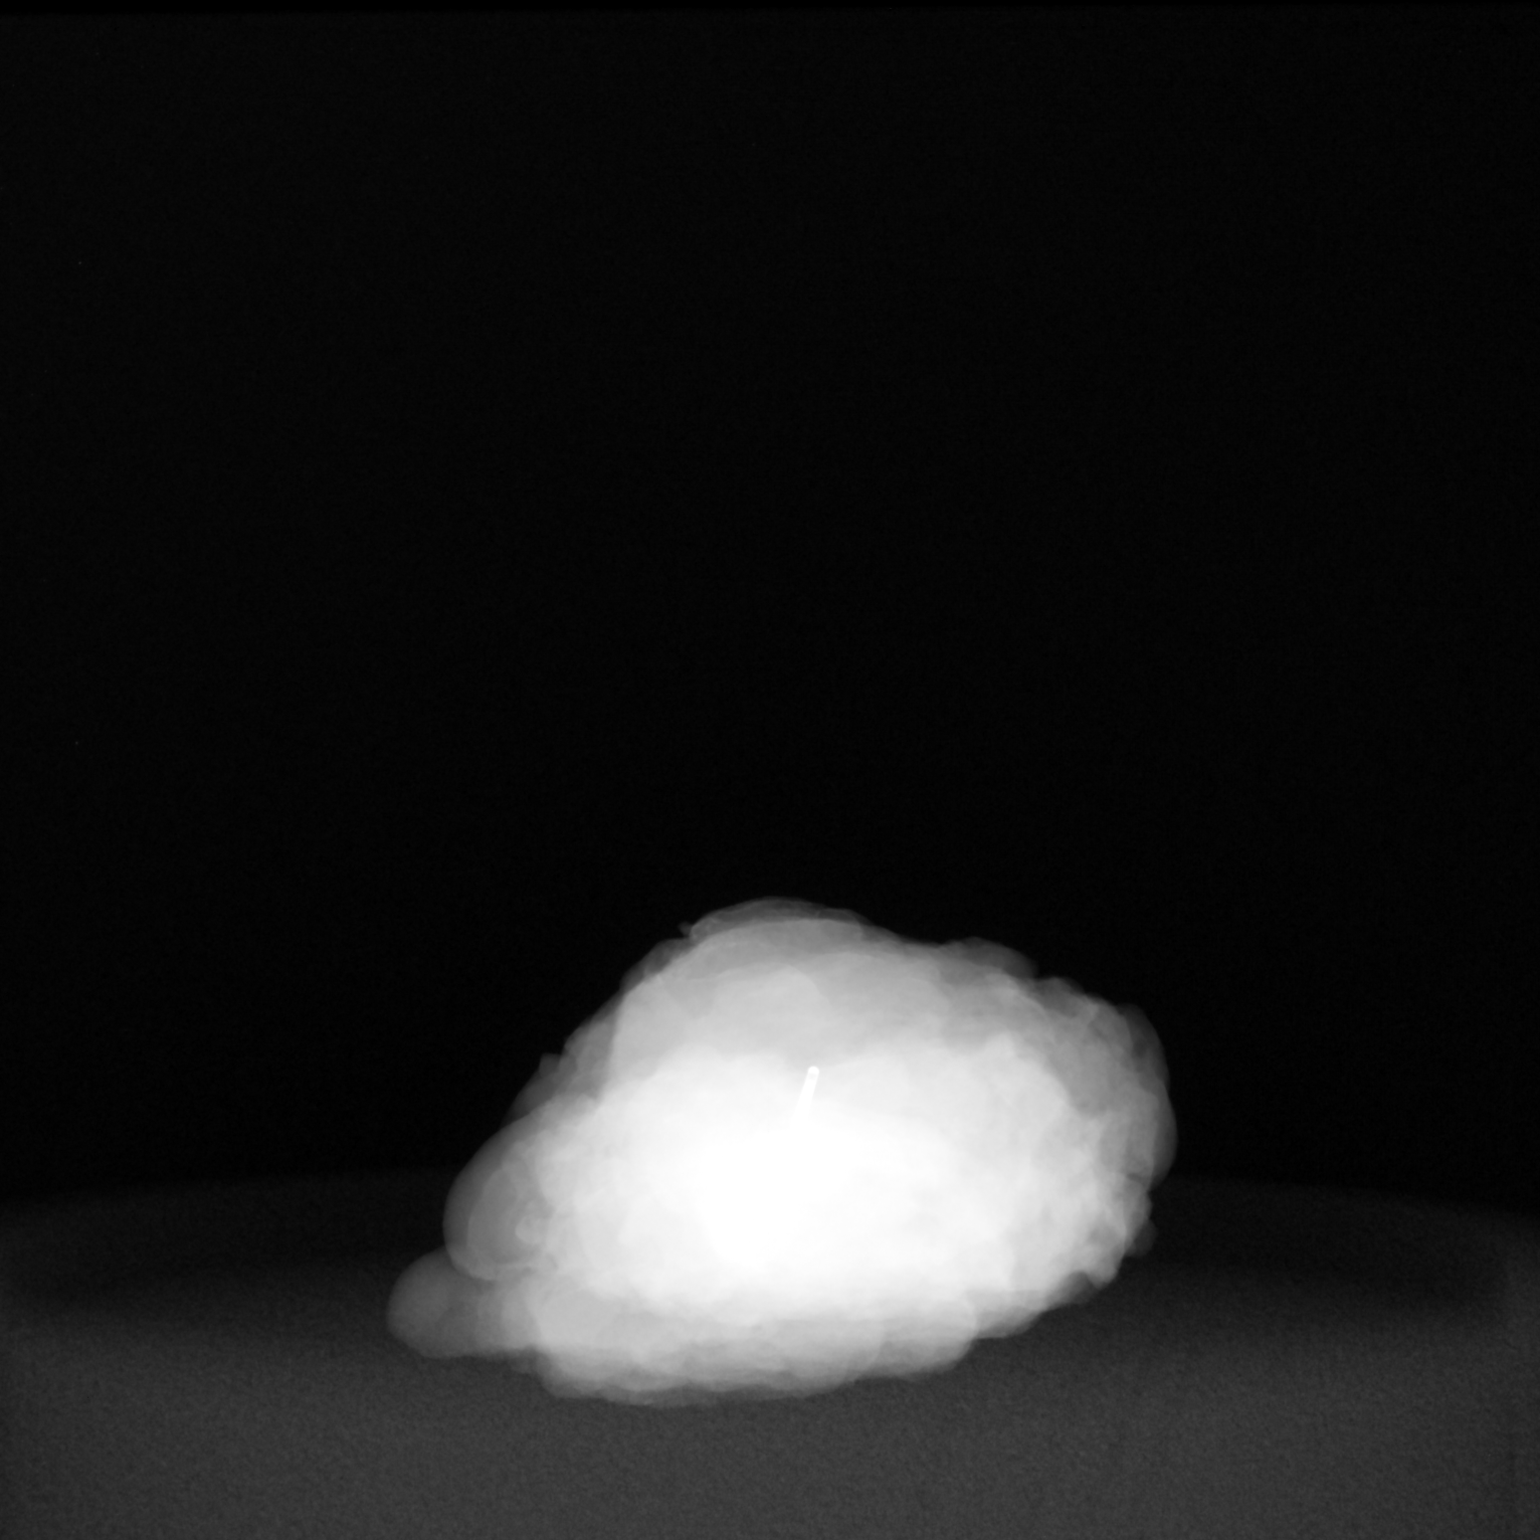

[2 of 2 positions shown; findings below may reference images not displayed]

FINDINGS: Status post excision of the right breast. Both the ribbon and coil
shaped biopsy clips and their respective radioactive seeds are
present, intact, within the central specimen.
IMPRESSION: Specimen radiograph of the right breast.

## 2021-03-04 DIAGNOSIS — E890 Postprocedural hypothyroidism: Secondary | ICD-10-CM | POA: Diagnosis not present

## 2021-04-06 ENCOUNTER — Other Ambulatory Visit: Payer: Self-pay

## 2021-04-06 ENCOUNTER — Encounter: Payer: Self-pay | Admitting: Nurse Practitioner

## 2021-04-06 ENCOUNTER — Ambulatory Visit (INDEPENDENT_AMBULATORY_CARE_PROVIDER_SITE_OTHER): Payer: BC Managed Care – PPO | Admitting: Nurse Practitioner

## 2021-04-06 VITALS — BP 112/78 | HR 100 | Temp 98.4°F | Ht 68.0 in | Wt 205.2 lb

## 2021-04-06 DIAGNOSIS — E890 Postprocedural hypothyroidism: Secondary | ICD-10-CM | POA: Diagnosis not present

## 2021-04-06 DIAGNOSIS — E559 Vitamin D deficiency, unspecified: Secondary | ICD-10-CM | POA: Insufficient documentation

## 2021-04-06 DIAGNOSIS — Z79899 Other long term (current) drug therapy: Secondary | ICD-10-CM

## 2021-04-06 DIAGNOSIS — Z6831 Body mass index (BMI) 31.0-31.9, adult: Secondary | ICD-10-CM

## 2021-04-06 DIAGNOSIS — E7800 Pure hypercholesterolemia, unspecified: Secondary | ICD-10-CM | POA: Diagnosis not present

## 2021-04-06 DIAGNOSIS — Z170 Estrogen receptor positive status [ER+]: Secondary | ICD-10-CM

## 2021-04-06 DIAGNOSIS — J302 Other seasonal allergic rhinitis: Secondary | ICD-10-CM

## 2021-04-06 DIAGNOSIS — I83812 Varicose veins of left lower extremities with pain: Secondary | ICD-10-CM | POA: Insufficient documentation

## 2021-04-06 DIAGNOSIS — Z0000 Encounter for general adult medical examination without abnormal findings: Secondary | ICD-10-CM | POA: Diagnosis not present

## 2021-04-06 DIAGNOSIS — Z9889 Other specified postprocedural states: Secondary | ICD-10-CM

## 2021-04-06 DIAGNOSIS — C50919 Malignant neoplasm of unspecified site of unspecified female breast: Secondary | ICD-10-CM | POA: Insufficient documentation

## 2021-04-06 DIAGNOSIS — M722 Plantar fascial fibromatosis: Secondary | ICD-10-CM

## 2021-04-06 DIAGNOSIS — C50511 Malignant neoplasm of lower-outer quadrant of right female breast: Secondary | ICD-10-CM

## 2021-04-06 NOTE — Patient Instructions (Signed)

## 2021-04-06 NOTE — Progress Notes (Signed)
?[name] as a Education administrator for [employer], FNP.,have documented all relevant documentation on the behalf of Cassandra Brine, FNP,as directed by  Cassandra Brine, FNP while in the presence of Cassandra Torres, [place].  ? ?This visit occurred during the SARS-CoV-2 public health emergency.  Safety protocols were in place, including screening questions prior to the visit, additional usage of staff PPE, and extensive cleaning of exam room while observing appropriate contact time as indicated for disinfecting solutions. ? ?Subjective:  ?  ? Patient ID: Cassandra Torres , female    DOB: 07/24/1965 , 56 y.o.   MRN: [id number] ? ? ?Chief Complaint  ?Patient presents with  ? Annual Exam  ? ? ?HPI ? ?Patient here for hm. She is followed by Dr. [name], she had radioactive iodine in February 2022, continues to see her for her thyroid levels next appt next month. She had breast cancer and was treated with Radiation only completed in June 2021. Continues to take Tamoxifen for 5 years, continues to follow up at the [facility] - Dr. Lindi Adie. Last pap was 02/12/2019 here at the office.  ? ?She is filing a VA claim  ? 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 She reports possibly being exposed to asbestos while in the [employer] she tries to stay out of direct sun and covered when going to the beach. She does have the records from the dermatologist, has not been back in several years. She has been trying to manage on her own.   ? ?  ? ?Past Medical History:  ?Diagnosis Date  ? Cancer ([place]) 02/2019  ? right breast IDC  ? Family history of brain cancer   ? Family history of breast cancer   ? Family history of prostate cancer   ? Personal history of radiation therapy   ? Plantar wart 06/11/2020  ? PONV (postoperative nausea and vomiting)   ? Seasonal allergies   ? Sun allergy 06/11/2020  ? Thyrotoxicosis   ?  ? ?Family History  ?Problem Relation Age of Onset   ? Hypertension Mother   ? Healthy Father   ? Prostate cancer Maternal Grandfather   ?     dx 75s  ? Breast cancer Paternal Aunt   ?     dx 33s  ? Brain cancer Maternal Grandmother   ?     d. 70  ? ? ? ?Current Outpatient Medications:  ?  cetirizine (ZYRTEC) 10 MG tablet, 1 tablet, Disp: , Rfl:  ?  diphenhydrAMINE (BENADRYL) 25 mg capsule, Take 25 mg by mouth as needed., Disp: , Rfl:  ?  levothyroxine (SYNTHROID) 50 MCG tablet, Take 50 mcg by mouth daily., Disp: , Rfl:  ?  loratadine (CLARITIN) 10 MG tablet, Take by mouth., Disp: , Rfl:  ?  Multiple Vitamins-Minerals (MULTIVITAMIN ADULT EXTRA C PO), See admin instructions., Disp: , Rfl:  ?  tamoxifen (NOLVADEX) 20 MG tablet, Take 1 tablet (20 mg total) by mouth daily., Disp: 90 tablet, Rfl: 3 ?  Cholecalciferol (VITAMIN D) 50 MCG (2000 UT) tablet, 1 tablet (Patient not taking: Reported on 04/06/2021), Disp: , Rfl:   ? ?Allergies  ?Allergen Reactions  ? Elemental Sulfur Hives  ? Levaquin [Levofloxacin] Hives  ? Penicillins Hives  ?  ? ? ?The patient states she has not had a menstrual cycle since being diagnosed with breast cancer.  No LMP recorded (lmp unknown). (Menstrual status: Irregular Periods).. Negative for  Dysmenorrhea and Negative for Menorrhagia. Negative for: breast discharge, breast lump(s), breast pain and breast self exam. She has been having hot flashes since taking the Tamoxifen.  Associated symptoms include abnormal vaginal bleeding. Pertinent negatives include abnormal bleeding (hematology), anxiety, decreased libido, depression, difficulty falling sleep, dyspareunia, history of infertility, nocturia, sexual dysfunction, sleep disturbances, urinary incontinence, urinary urgency, vaginal discharge and vaginal itching. Diet regular; reports trying to eat healthy.  The patient states her exercise level is none since the cold weather.  ? ?The patient's tobacco use is:  ?Social History  ? ?Tobacco Use  ?Smoking Status Never  ?Smokeless Tobacco Never   ? ?She has been exposed to passive smoke. The patient's alcohol use is:  ?Social History  ? ?Substance and Sexual Activity  ?Alcohol Use Yes  ? Comment: occasional  ? ?Additional information: Last pap 02/12/2019, next one scheduled for 02/11/2022.   ? ?Review of Systems  ?Constitutional: Negative.   ?HENT: Negative.    ?     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  ?Eyes: Negative.   ?Respiratory: Negative.    ?Cardiovascular: Negative.   ?     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 Continue to be present  ?Gastrointestinal: Negative.   ?Endocrine: Negative.   ?Genitourinary: Negative.   ?Musculoskeletal: Negative.   ?Skin: Negative.   ?     Reports having a plantars wart that is causing her pain to her left foot arch area, she is being more careful with shoes. She has had it cut out in the past by the podiatrist more than 5 years ago.  ?Allergic/Immunologic: Negative.   ?Neurological: Negative.   ?Hematological: Negative.   ?Psychiatric/Behavioral: Negative.     ? ?Today's Vitals  ? 04/06/21 0827  ?BP: 112/78  ?Pulse: 100  ?Temp: 98.4 ?F (36.9 ?C)  ?Weight: 205 lb 3.2 oz (93.1 kg)  ?Height: '5\' 8"'$  (1.727 m)  ? ?Body mass index is 31.2 kg/m?.  ?Wt Readings from Last 3 Encounters:  ?04/06/21 205 lb 3.2 oz (93.1 kg)  ?09/29/20 200 lb (90.7 kg)  ?06/05/20 207 lb 11.2 oz (94.2 kg)  ?  ?BP Readings from Last 3 Encounters:  ?04/06/21 112/78  ?06/05/20 118/78  ?04/02/20 128/80  ?  ?Objective:  ?Physical Exam ?Vitals reviewed.  ?Constitutional:   ?   General: She is not in acute distress. ?   Appearance: Normal appearance. She is well-developed. She is obese.  ?HENT:  ?   Head: Normocephalic and atraumatic.  ?   Right Ear: Hearing, tympanic membrane, ear canal and external ear normal. There is no impacted cerumen.  ?   Left Ear: Hearing, tympanic membrane, ear canal and external ear normal. There is no impacted cerumen.  ?   Nose:  ?   Comments: Deferred - masked ?    Mouth/Throat:  ?   Comments: Deferred - masked ?Eyes:  ?   General: Lids are normal.  ?   Extraocular Movements: Extraocular movements intact.  ?   Conjunctiva/sclera: Conjunctivae normal.  ?   Pupils: Pupils are equal, round, and reactive to light.  ?   Funduscopic exam: ?   Right eye: No papilledema.     ?   Left eye: No papilledema.  ?Neck:  ?   Thyroid: No thyroid mass.  ?   Vascular: No carotid bruit.  ?   Comments: She has neck fullness ?Cardiovascular:  ?   Rate  and Rhythm: Normal rate and regular rhythm.  ?   Pulses: Normal pulses.  ?   Heart sounds: Normal heart sounds. No murmur heard. ?Pulmonary:  ?   Effort: Pulmonary effort is normal. No respiratory distress.  ?   Breath sounds: Normal breath sounds. No wheezing.  ?Chest:  ?   Chest wall: No mass.  ?Breasts: ?   Tanner Score is 5.  ?   Right: Normal. No mass or tenderness.  ?   Left: Normal. No mass or tenderness.  ? ? ?   Comments: Right breast extremely tender  ?Abdominal:  ?   General: Abdomen is flat. Bowel sounds are normal. There is no distension.  ?   Palpations: Abdomen is soft. There is no mass.  ?   Tenderness: There is no abdominal tenderness.  ?Genitourinary: ?   Rectum: Guaiac result negative.  ?Musculoskeletal:     ?   General: No swelling. Normal range of motion.  ?   Cervical back: Full passive range of motion without pain, normal range of motion and neck supple.  ?   Right lower leg: No edema.  ?   Left lower leg: No edema.  ?Lymphadenopathy:  ?   Upper Body:  ?   Right upper body: No supraclavicular, axillary or pectoral adenopathy.  ?   Left upper body: No supraclavicular, axillary or pectoral adenopathy.  ?Skin: ?   General: Skin is warm and dry.  ?   Capillary Refill: Capillary refill takes less than 2 seconds.  ?Neurological:  ?   General: No focal deficit present.  ?   Mental Status: She is alert and oriented to person, place, and time.  ?   Cranial Nerves: No cranial nerve deficit.  ?   Sensory: No sensory deficit.  ?Psychiatric:      ?   Mood and Affect: Mood normal.     ?   Behavior: Behavior normal.     ?   Thought Content: Thought content normal.     ?   Judgment: Judgment normal.  ?  ? ?   ?Assessment And Plan:  ?   ?1. Encounter f

## 2021-04-07 LAB — LIPID PANEL
Chol/HDL Ratio: 2.7 ratio (ref 0.0–4.4)
Cholesterol, Total: 199 mg/dL (ref 100–199)
HDL: 75 mg/dL (ref >39)
LDL Chol Calc (NIH): 105 mg/dL — ABNORMAL HIGH (ref 0–99)
Triglycerides: 109 mg/dL (ref 0–149)
VLDL Cholesterol Cal: 19 mg/dL (ref 5–40)

## 2021-04-07 LAB — VITAMIN D 25 HYDROXY (VIT D DEFICIENCY, FRACTURES): Vit D, 25-Hydroxy: 24.4 ng/mL — ABNORMAL LOW (ref 30.0–100.0)

## 2021-04-07 LAB — CBC
Hematocrit: 42 % (ref 34.0–46.6)
Hemoglobin: 14 g/dL (ref 11.1–15.9)
MCH: 29 pg (ref 26.6–33.0)
MCHC: 33.3 g/dL (ref 31.5–35.7)
MCV: 87 fL (ref 79–97)
Platelets: 232 10*3/uL (ref 150–450)
RBC: 4.83 x10E6/uL (ref 3.77–5.28)
RDW: 12 % (ref 11.7–15.4)
WBC: 6.5 10*3/uL (ref 3.4–10.8)

## 2021-04-07 LAB — CMP14+EGFR
ALT: 18 IU/L (ref 0–32)
AST: 22 IU/L (ref 0–40)
Albumin/Globulin Ratio: 1.3 (ref 1.2–2.2)
Albumin: 4.3 g/dL (ref 3.8–4.9)
Alkaline Phosphatase: 104 IU/L (ref 44–121)
BUN/Creatinine Ratio: 8 — ABNORMAL LOW (ref 9–23)
BUN: 9 mg/dL (ref 6–24)
Bilirubin Total: <0.2 mg/dL (ref 0.0–1.2)
CO2: 24 mmol/L (ref 20–29)
Calcium: 9.4 mg/dL (ref 8.7–10.2)
Chloride: 103 mmol/L (ref 96–106)
Creatinine, Ser: 1.06 mg/dL — ABNORMAL HIGH (ref 0.57–1.00)
Globulin, Total: 3.3 g/dL (ref 1.5–4.5)
Glucose: 89 mg/dL (ref 70–99)
Potassium: 4.3 mmol/L (ref 3.5–5.2)
Sodium: 140 mmol/L (ref 134–144)
Total Protein: 7.6 g/dL (ref 6.0–8.5)
eGFR: 62 mL/min/{1.73_m2} (ref >59)

## 2021-04-14 MED ORDER — VITAMIN D (ERGOCALCIFEROL) 1.25 MG (50000 UNIT) PO CAPS: 50000.0000 [IU] | ORAL_CAPSULE | ORAL | 1 refills | Status: AC

## 2021-04-21 ENCOUNTER — Telehealth: Payer: Self-pay | Admitting: Hematology and Oncology

## 2021-04-21 NOTE — Telephone Encounter (Signed)
Spoke to Ms. Mohl about scheduling a sooner appt with Dr. Lindi Adie per updated referral. Ms. Attaway told me she is fine keeping her appt on 5/26 and really just needed the referral for insurance purposes. She requested I close out the referral as it is no longer needed by her and she will keep appt on 5/26.  ?

## 2021-04-23 ENCOUNTER — Other Ambulatory Visit: Payer: Self-pay | Admitting: Hematology and Oncology

## 2021-04-27 ENCOUNTER — Encounter: Payer: BC Managed Care – PPO | Admitting: Adult Health

## 2021-05-15 ENCOUNTER — Telehealth: Payer: Self-pay | Admitting: Hematology and Oncology

## 2021-05-15 NOTE — Telephone Encounter (Signed)
Rescheduled appointment per providers template. Left message.  ? ?

## 2021-05-29 NOTE — Progress Notes (Signed)
Patient Care Team: Glendale Chard, MD as PCP - General (Internal Medicine) Rolm Bookbinder, MD as Consulting Physician (General Surgery) Nicholas Lose, MD as Consulting Physician (Hematology and Oncology) Gery Pray, MD as Consulting Physician (Radiation Oncology)  DIAGNOSIS:  Encounter Diagnosis  Name Primary?   Malignant neoplasm of lower-outer quadrant of right breast of female, estrogen receptor positive ([name])     SUMMARY OF ONCOLOGIC HISTORY: Oncology History  Malignant neoplasm of lower-outer quadrant of right breast of female, estrogen receptor positive ([name])  03/02/2019 Initial Diagnosis   Screening mammogram showed right breast masses. Diagnostic mammogram and US showed a 1.2cm mass at the 7 o'clock position 7cm from the nipple, and a 1.1cm mass at the 7 o'clock position 6cm from the nipple, spanning 2.3cm in total, with no evidence of right axillary adenopathy. Biopsy showed IDC, grade 3, HER-2 equivocal by IHC, negative by FISH, ER+ 90%, PR+ 20%, Ki67 2%.    03/17/2019 Genetic Testing   Negative genetic testing. No pathogenic variants identified on the [facility] Breast Cancer STAT Panel + Common Hereditary Cancers Panel + Brain Cancer Panel. VUS in KIT called [id number] identified. The report date is 03/17/2019.  The Breast Cancer STAT Panel + Common Hereditary Cancers Panel + Brain Cancer Panel offered by [facility] includes sequencing and/or deletion duplication testing of the following 70 genes: AIP, ALK, APC*, ATM*, AXIN2, BAP1, BARD1, BMPR1A, BRCA1, BRCA2, BRIP1, CDH1, CDK4, CDKN2A (p14ARF), CDKN2A (p16INK4a), CHEK2, CTNNA1, DICER1*, EPCAM*, EZH2*, GPC3*, GREM1*, HOXB13, HRAS, KIF1B*, KIT, LZTR1, MAX*, MEN1*, MLH1*, MSH2*, MSH3*, MSH6*, MUTYH, NBN, NF1*, NF2, NTHL1, PALB2, PDGFRA, PHOX2B*, PMS2*, POLD1*, POLE, POT1, PRKAR1A, PTCH1, PTCH2, PTEN*, RAD50, RAD51C, RAD51D, RB1*, RET, RNF43, SDHA*, SDHAF2, SDHB, SDHC*, SDHD, SMAD4, SMARCA4, SMARCB1, SMARCE1, STK11, SUFU, TMEM127, TP53,  TSC1*, TSC2, VHL.   03/21/2019 Surgery   Right lumpectomy [name]) [phone]): IDC, 2.5cm, grade 2, 4 right axillary lymph nodes negative for carcinoma. Carcinoma focally involved posterior and medial margins and was present <1 mm from the lateral margin.   03/30/2019 Cancer Staging   Staging form: Breast, AJCC 8th Edition - Pathologic stage from 03/30/2019: Stage IA (pT2, pN0, cM0, G2, ER+, PR+, HER2-)   03/30/2019 Oncotype testing   The Oncotype DX score was 14 predicting a risk of outside the breast recurrence over the next 9 years of 4% if the patient's only systemic therapy is tamoxifen for 5 years.    04/30/2019 Surgery   Re-excision of positive margin [name]) [phone]): remaining invasive ductal carcinoma, 0.6cm, grade 2.   06/14/2019 - 07/11/2019 Radiation Therapy   The patient initially received a dose of 40.05 Gy in 15 fractions to the breast using whole-breast tangent fields. This was delivered using a 3-D conformal technique. The pt received a boost delivering an additional 12 Gy in 6 fractions using a electron boost with 20mV electrons. The total dose was 52.05 Gy.   08/2019 -  Anti-estrogen oral therapy   Tamoxifen, to be switched to Anastrozole once she is menopausal     CHIEF COMPLIANT: Follow-up of right breast cancer on tamoxifen  INTERVAL HISTORY: Cassandra Torres a 56y.o. with above-mentioned history of right breast cancer. She presents to the clinic today for a follow-up. States that she is tolerating the tamoxifen. States she has tolerable hot flashes and leg joint pain. Denies pain and discomfort in breast.   ALLERGIES:  is allergic to elemental sulfur, levaquin [levofloxacin], penicillins, and sulfamethoxazole-trimethoprim.  MEDICATIONS:  Current Outpatient Medications  Medication Sig Dispense Refill   cetirizine (ZYRTEC) 10  MG tablet 1 tablet     diphenhydrAMINE (BENADRYL) 25 mg capsule Take 25 mg by mouth as needed.     levothyroxine (SYNTHROID)  50 MCG tablet Take 50 mcg by mouth daily.     loratadine (CLARITIN) 10 MG tablet Take by mouth.     Multiple Vitamins-Minerals (MULTIVITAMIN ADULT EXTRA C PO) See admin instructions.     tamoxifen (NOLVADEX) 20 MG tablet TAKE 1 TABLET BY MOUTH EVERY DAY 90 tablet 3   Vitamin D, Ergocalciferol, (DRISDOL) 1.25 MG (50000 UNIT) CAPS capsule Take 1 capsule (50,000 Units total) by mouth every 7 (seven) days. 12 capsule 1   No current facility-administered medications for this visit.    PHYSICAL EXAMINATION: ECOG PERFORMANCE STATUS: 1 - Symptomatic but completely ambulatory  Vitals:   06/04/21 1525  BP: (!) 144/67  Pulse: 97  Resp: 17  Temp: 97.7 F (36.5 C)  SpO2: 100%   Filed Weights   06/04/21 1525  Weight: 208 lb 9.6 oz (94.6 kg)    BREAST: No palpable masses or nodules in either right or left breasts. No palpable axillary supraclavicular or infraclavicular adenopathy no breast tenderness or nipple discharge. (exam performed in the presence of a chaperone)  LABORATORY DATA:  I have reviewed the data as listed    Latest Ref Rng & Units 04/06/2021    9:15 AM 04/02/2020    9:15 AM 03/07/2019   12:39 PM  CMP  Glucose 70 - 99 mg/dL 89   87   90    BUN 6 - 24 mg/dL _0 Creatinine 0.57 - 1.00 mg/dL 1.06   0.82   0.82    Sodium 134 - 144 mmol/L 140   142   141    Potassium 3.5 - 5.2 mmol/L 4.3   4.2   4.0    Chloride 96 - 106 mmol/L 103   103   105    CO2 20 - 29 mmol/L _1 Calcium 8.7 - 10.2 mg/dL 9.4   9.2   9.3    Total Protein 6.0 - 8.5 g/dL 7.6   7.1   8.0    Total Bilirubin 0.0 - 1.2 mg/dL <0.2   0.3   0.2    Alkaline Phos 44 - 121 IU/L 104   90   97    AST 0 - 40 IU/L _2 ALT 0 - 32 IU/L _3 Lab Results  Component Value Date   WBC 6.5 04/06/2021   HGB 14.0 04/06/2021   HCT 42.0 04/06/2021   MCV 87 04/06/2021   PLT 232 04/06/2021   NEUTROABS 5.3 03/07/2019    ASSESSMENT & PLAN:  Malignant neoplasm of lower-outer  quadrant of right breast of female, estrogen receptor positive ([place]) 03/21/2019:Right lumpectomy [name]): IDC, 2.5cm, grade 2, 4 right axillary lymph nodes negative for carcinoma.  ER 90%, PR 20%, Ki-67 2%, HER-2 negative T2N0 stage Ia Genetic testing: Negative 04/30/2019: Margin reexcision: Residual IDC 0.6 cm grade 2 Oncotype DX score: 14: Risk of recurrence: 4% Adjuvant radiation 06/14/2019-07/11/2019   Recommendation: Adjuvant antiestrogen therapy with tamoxifen 20 mg daily to be switched to anastrozole once she is menopausal   Tamoxifen Toxicities: Denies any adverse effects with tamoxifen.  Denies any hot flashes or arthralgias or myalgias.   Breast cancer surveillance: 1.  Breast exam 06/04/2021: Benign 2. mammogram and ultrasound left breast 02/09/2021: Benign postsurgical changes, density category B   Return to clinic in 1 year for follow-up    No orders of the defined types were placed in this encounter.  The patient has a good understanding of the overall plan. she agrees with it. she will call with any problems that may develop before the next visit here. Total time spent: 30 mins including face to face time and time spent for planning, charting and co-ordination of care   Harriette Ohara, MD 06/04/21    I Gardiner Coins am scribing for Dr. Lindi Adie  I have reviewed the above documentation for accuracy and completeness, and I agree with the above.

## 2021-06-04 ENCOUNTER — Other Ambulatory Visit: Payer: Self-pay

## 2021-06-04 ENCOUNTER — Inpatient Hospital Stay: Payer: BC Managed Care – PPO | Attending: Hematology and Oncology | Admitting: Hematology and Oncology

## 2021-06-04 DIAGNOSIS — Z170 Estrogen receptor positive status [ER+]: Secondary | ICD-10-CM | POA: Diagnosis not present

## 2021-06-04 DIAGNOSIS — Z79810 Long term (current) use of selective estrogen receptor modulators (SERMs): Secondary | ICD-10-CM | POA: Insufficient documentation

## 2021-06-04 DIAGNOSIS — C50511 Malignant neoplasm of lower-outer quadrant of right female breast: Secondary | ICD-10-CM | POA: Insufficient documentation

## 2021-06-04 DIAGNOSIS — Z923 Personal history of irradiation: Secondary | ICD-10-CM | POA: Insufficient documentation

## 2021-06-04 NOTE — Assessment & Plan Note (Signed)
03/21/2019:Right lumpectomy Cassandra Torres): IDC, 2.5cm, grade 2, 4 right axillary lymph nodes negative for carcinoma.ER 90%, PR 20%, Ki-67 2%, HER-2 negative T2N0 stage Ia Genetic testing: Negative 04/30/2019: Margin reexcision: Residual IDC 0.6 cm grade 2 Oncotype DX score: 14: Risk of recurrence: 4% Adjuvant radiation 06/14/2019-07/11/2019  Recommendation:Adjuvant antiestrogen therapy withtamoxifen 20 mg daily to be switched to anastrozole once she is menopausal  Tamoxifen Toxicities: Denies any adverse effects with tamoxifen.  Denies any hot flashes or arthralgias or myalgias.  Breast cancer surveillance: 1.  Breast exam 06/04/2021: Benign 2. mammogram and ultrasound left breast 02/09/2021: Benign postsurgical changes, density category B  Return to clinic in 1 year for follow-up

## 2021-06-05 ENCOUNTER — Telehealth: Payer: Self-pay | Admitting: Hematology and Oncology

## 2021-06-05 ENCOUNTER — Ambulatory Visit: Payer: 59 | Admitting: Hematology and Oncology

## 2021-06-05 NOTE — Telephone Encounter (Signed)
Scheduled appointment per 5/25 los. Patient is aware.

## 2021-08-03 ENCOUNTER — Encounter: Payer: Self-pay | Admitting: Internal Medicine

## 2021-08-03 ENCOUNTER — Telehealth (INDEPENDENT_AMBULATORY_CARE_PROVIDER_SITE_OTHER): Payer: BC Managed Care – PPO | Admitting: Internal Medicine

## 2021-08-03 DIAGNOSIS — R0981 Nasal congestion: Secondary | ICD-10-CM

## 2021-08-03 DIAGNOSIS — U071 COVID-19: Secondary | ICD-10-CM | POA: Diagnosis not present

## 2021-08-03 MED ORDER — MOLNUPIRAVIR EUA 200MG CAPSULE: 4.0000 | ORAL_CAPSULE | Freq: Two times a day (BID) | ORAL | 0 refills | Status: AC

## 2021-08-03 NOTE — Patient Instructions (Signed)
COVID-19: Quarantine and Isolation °Quarantine °If you were exposed °Quarantine and stay away from others when you have been in close contact with someone who has COVID-19. °Isolate °If you are sick or test positive °Isolate when you are sick or when you have COVID-19, even if you don't have symptoms. °When to stay home °Calculating quarantine °The date of your exposure is considered day 0. Day 1 is the first full day after your last contact with a person who has had COVID-19. Stay home and away from other people for at least 5 days. Learn why CDC updated guidance for the general public. °IF YOU were exposed to COVID-19 and are NOT  °up to dateIF YOU were exposed to COVID-19 and are NOT on COVID-19 vaccinations °Quarantine for at least 5 days °Stay home °Stay home and quarantine for at least 5 full days. °Wear a well-fitting mask if you must be around others in your home. °Do not travel. °Get tested °Even if you don't develop symptoms, get tested at least 5 days after you last had close contact with someone with COVID-19. °After quarantine °Watch for symptoms °Watch for symptoms until 10 days after you last had close contact with someone with COVID-19. °Avoid travel °It is best to avoid travel until a full 10 days after you last had close contact with someone with COVID-19. °If you develop symptoms °Isolate immediately and get tested. Continue to stay home until you know the results. Wear a well-fitting mask around others. °Take precautions until day 10 °Wear a well-fitting mask °Wear a well-fitting mask for 10 full days any time you are around others inside your home or in public. Do not go to places where you are unable to wear a well-fitting mask. °If you must travel during days 6-10, take precautions. °Avoid being around people who are more likely to get very sick from COVID-19. °IF YOU were exposed to COVID-19 and are  °up to dateIF YOU were exposed to COVID-19 and are on COVID-19 vaccinations °No  quarantine °You do not need to stay home unless you develop symptoms. °Get tested °Even if you don't develop symptoms, get tested at least 5 days after you last had close contact with someone with COVID-19. °Watch for symptoms °Watch for symptoms until 10 days after you last had close contact with someone with COVID-19. °If you develop symptoms °Isolate immediately and get tested. Continue to stay home until you know the results. Wear a well-fitting mask around others. °Take precautions until day 10 °Wear a well-fitting mask °Wear a well-fitting mask for 10 full days any time you are around others inside your home or in public. Do not go to places where you are unable to wear a well-fitting mask. °Take precautions if traveling °Avoid being around people who are more likely to get very sick from COVID-19. °IF YOU were exposed to COVID-19 and had confirmed COVID-19 within the past 90 days (you tested positive using a viral test) °No quarantine °You do not need to stay home unless you develop symptoms. °Watch for symptoms °Watch for symptoms until 10 days after you last had close contact with someone with COVID-19. °If you develop symptoms °Isolate immediately and get tested. Continue to stay home until you know the results. Wear a well-fitting mask around others. °Take precautions until day 10 °Wear a well-fitting mask °Wear a well-fitting mask for 10 full days any time you are around others inside your home or in public. Do not go to places where you are   unable to wear a well-fitting mask. °Take precautions if traveling °Avoid being around people who are more likely to get very sick from COVID-19. °Calculating isolation °Day 0 is your first day of symptoms or a positive viral test. Day 1 is the first full day after your symptoms developed or your test specimen was collected. If you have COVID-19 or have symptoms, isolate for at least 5 days. °IF YOU tested positive for COVID-19 or have symptoms, regardless of  vaccination status °Stay home for at least 5 days °Stay home for 5 days and isolate from others in your home. °Wear a well-fitting mask if you must be around others in your home. °Do not travel. °Ending isolation if you had symptoms °End isolation after 5 full days if you are fever-free for 24 hours (without the use of fever-reducing medication) and your symptoms are improving. °Ending isolation if you did NOT have symptoms °End isolation after at least 5 full days after your positive test. °If you got very sick from COVID-19 or have a weakened immune system °You should isolate for at least 10 days. Consult your doctor before ending isolation. °Take precautions until day 10 °Wear a well-fitting mask °Wear a well-fitting mask for 10 full days any time you are around others inside your home or in public. Do not go to places where you are unable to wear a well-fitting mask. °Do not travel °Do not travel until a full 10 days after your symptoms started or the date your positive test was taken if you had no symptoms. °Avoid being around people who are more likely to get very sick from COVID-19. °Definitions °Exposure °Contact with someone infected with SARS-CoV-2, the virus that causes COVID-19, in a way that increases the likelihood of getting infected with the virus. °Close contact °A close contact is someone who was less than 6 feet away from an infected person (laboratory-confirmed or a clinical diagnosis) for a cumulative total of 15 minutes or more over a 24-hour period. For example, three individual 5-minute exposures for a total of 15 minutes. People who are exposed to someone with COVID-19 after they completed at least 5 days of isolation are not considered close contacts. °Quarantine °Quarantine is a strategy used to prevent transmission of COVID-19 by keeping people who have been in close contact with someone with COVID-19 apart from others. °Who does not need to quarantine? °If you had close contact with  someone with COVID-19 and you are in one of the following groups, you do not need to quarantine. °You are up to date with your COVID-19 vaccines. °You had confirmed COVID-19 within the last 90 days (meaning you tested positive using a viral test). °If you are up to date with COVID-19 vaccines, you should wear a well-fitting mask around others for 10 days from the date of your last close contact with someone with COVID-19 (the date of last close contact is considered day 0). Get tested at least 5 days after you last had close contact with someone with COVID-19. If you test positive or develop COVID-19 symptoms, isolate from other people and follow recommendations in the Isolation section below. If you tested positive for COVID-19 with a viral test within the previous 90 days and subsequently recovered and remain without COVID-19 symptoms, you do not need to quarantine or get tested after close contact. You should wear a well-fitting mask around others for 10 days from the date of your last close contact with someone with COVID-19 (the date of last   close contact is considered day 0). If you have COVID-19 symptoms, get tested and isolate from other people and follow recommendations in the Isolation section below. °Who should quarantine? °If you come into close contact with someone with COVID-19, you should quarantine if you are not up to date on COVID-19 vaccines. This includes people who are not vaccinated. °What to do for quarantine °Stay home and away from other people for at least 5 days (day 0 through day 5) after your last contact with a person who has COVID-19. The date of your exposure is considered day 0. Wear a well-fitting mask when around others at home, if possible. °For 10 days after your last close contact with someone with COVID-19, watch for fever (100.4°F or greater), cough, shortness of breath, or other COVID-19 symptoms. °If you develop symptoms, get tested immediately and isolate until you receive  your test results. If you test positive, follow isolation recommendations. °If you do not develop symptoms, get tested at least 5 days after you last had close contact with someone with COVID-19. °If you test negative, you can leave your home, but continue to wear a well-fitting mask when around others at home and in public until 10 days after your last close contact with someone with COVID-19. °If you test positive, you should isolate for at least 5 days from the date of your positive test (if you do not have symptoms). If you do develop COVID-19 symptoms, isolate for at least 5 days from the date your symptoms began (the date the symptoms started is day 0). Follow recommendations in the isolation section below. °If you are unable to get a test 5 days after last close contact with someone with COVID-19, you can leave your home after day 5 if you have been without COVID-19 symptoms throughout the 5-day period. Wear a well-fitting mask for 10 days after your date of last close contact when around others at home and in public. °Avoid people who are have weakened immune systems or are more likely to get very sick from COVID-19, and nursing homes and other high-risk settings, until after at least 10 days. °If possible, stay away from people you live with, especially people who are at higher risk for getting very sick from COVID-19, as well as others outside your home throughout the full 10 days after your last close contact with someone with COVID-19. °If you are unable to quarantine, you should wear a well-fitting mask for 10 days when around others at home and in public. °If you are unable to wear a mask when around others, you should continue to quarantine for 10 days. Avoid people who have weakened immune systems or are more likely to get very sick from COVID-19, and nursing homes and other high-risk settings, until after at least 10 days. °See additional information about travel. °Do not go to places where you are  unable to wear a mask, such as restaurants and some gyms, and avoid eating around others at home and at work until after 10 days after your last close contact with someone with COVID-19. °After quarantine °Watch for symptoms until 10 days after your last close contact with someone with COVID-19. °If you have symptoms, isolate immediately and get tested. °Quarantine in high-risk congregate settings °In certain congregate settings that have high risk of secondary transmission (such as correctional and detention facilities, homeless shelters, or cruise ships), CDC recommends a 10-day quarantine for residents, regardless of vaccination and booster status. During periods of critical staffing   shortages, facilities may consider shortening the quarantine period for staff to ensure continuity of operations. Decisions to shorten quarantine in these settings should be made in consultation with state, local, tribal, or territorial health departments and should take into consideration the context and characteristics of the facility. CDC's setting-specific guidance provides additional recommendations for these settings. °Isolation °Isolation is used to separate people with confirmed or suspected COVID-19 from those without COVID-19. People who are in isolation should stay home until it's safe for them to be around others. At home, anyone sick or infected should separate from others, or wear a well-fitting mask when they need to be around others. People in isolation should stay in a specific "sick room" or area and use a separate bathroom if available. Everyone who has presumed or confirmed COVID-19 should stay home and isolate from other people for at least 5 full days (day 0 is the first day of symptoms or the date of the day of the positive viral test for asymptomatic persons). They should wear a mask when around others at home and in public for an additional 5 days. People who are confirmed to have COVID-19 or are showing  symptoms of COVID-19 need to isolate regardless of their vaccination status. This includes: °People who have a positive viral test for COVID-19, regardless of whether or not they have symptoms. °People with symptoms of COVID-19, including people who are awaiting test results or have not been tested. People with symptoms should isolate even if they do not know if they have been in close contact with someone with COVID-19. °What to do for isolation °Monitor your symptoms. If you have an emergency warning sign (including trouble breathing), seek emergency medical care immediately. °Stay in a separate room from other household members, if possible. °Use a separate bathroom, if possible. °Take steps to improve ventilation at home, if possible. °Avoid contact with other members of the household and pets. °Don't share personal household items, like cups, towels, and utensils. °Wear a well-fitting mask when you need to be around other people. °Learn more about what to do if you are sick and how to notify your contacts. °Ending isolation for people who had COVID-19 and had symptoms °If you had COVID-19 and had symptoms, isolate for at least 5 days. To calculate your 5-day isolation period, day 0 is your first day of symptoms. Day 1 is the first full day after your symptoms developed. You can leave isolation after 5 full days. °You can end isolation after 5 full days if you are fever-free for 24 hours without the use of fever-reducing medication and your other symptoms have improved (Loss of taste and smell may persist for weeks or months after recovery and need not delay the end of isolation). °You should continue to wear a well-fitting mask around others at home and in public for 5 additional days (day 6 through day 10) after the end of your 5-day isolation period. If you are unable to wear a mask when around others, you should continue to isolate for a full 10 days. Avoid people who have weakened immune systems or are more  likely to get very sick from COVID-19, and nursing homes and other high-risk settings, until after at least 10 days. °If you continue to have fever or your other symptoms have not improved after 5 days of isolation, you should wait to end your isolation until you are fever-free for 24 hours without the use of fever-reducing medication and your other symptoms have improved.   Continue to wear a well-fitting mask through day 10. Contact your healthcare provider if you have questions. °See additional information about travel. °Do not go to places where you are unable to wear a mask, such as restaurants and some gyms, and avoid eating around others at home and at work until a full 10 days after your first day of symptoms. °If an individual has access to a test and wants to test, the best approach is to use an antigen test1 towards the end of the 5-day isolation period. Collect the test sample only if you are fever-free for 24 hours without the use of fever-reducing medication and your other symptoms have improved (loss of taste and smell may persist for weeks or months after recovery and need not delay the end of isolation). If your test result is positive, you should continue to isolate until day 10. If your test result is negative, you can end isolation, but continue to wear a well-fitting mask around others at home and in public until day 10. Follow additional recommendations for masking and avoiding travel as described above. °1As noted in the labeling for authorized over-the counter antigen tests: Negative results should be treated as presumptive. Negative results do not rule out SARS-CoV-2 infection and should not be used as the sole basis for treatment or patient management decisions, including infection control decisions. To improve results, antigen tests should be used twice over a three-day period with at least 24 hours and no more than 48 hours between tests. °Note that these recommendations on ending isolation  do not apply to people who are moderately ill or very sick from COVID-19 or have weakened immune systems. See section below for recommendations for when to end isolation for these groups. °Ending isolation for people who tested positive for COVID-19 but had no symptoms °If you test positive for COVID-19 and never develop symptoms, isolate for at least 5 days. Day 0 is the day of your positive viral test (based on the date you were tested) and day 1 is the first full day after the specimen was collected for your positive test. You can leave isolation after 5 full days. °If you continue to have no symptoms, you can end isolation after at least 5 days. °You should continue to wear a well-fitting mask around others at home and in public until day 10 (day 6 through day 10). If you are unable to wear a mask when around others, you should continue to isolate for 10 days. Avoid people who have weakened immune systems or are more likely to get very sick from COVID-19, and nursing homes and other high-risk settings, until after at least 10 days. °If you develop symptoms after testing positive, your 5-day isolation period should start over. Day 0 is your first day of symptoms. Follow the recommendations above for ending isolation for people who had COVID-19 and had symptoms. °See additional information about travel. °Do not go to places where you are unable to wear a mask, such as restaurants and some gyms, and avoid eating around others at home and at work until 10 days after the day of your positive test. °If an individual has access to a test and wants to test, the best approach is to use an antigen test1 towards the end of the 5-day isolation period. If your test result is positive, you should continue to isolate until day 10. If your test result is positive, you can also choose to test daily and if your test result   is negative, you can end isolation, but continue to wear a well-fitting mask around others at home and in  public until day 10. Follow additional recommendations for masking and avoiding travel as described above. °1As noted in the labeling for authorized over-the counter antigen tests: Negative results should be treated as presumptive. Negative results do not rule out SARS-CoV-2 infection and should not be used as the sole basis for treatment or patient management decisions, including infection control decisions. To improve results, antigen tests should be used twice over a three-day period with at least 24 hours and no more than 48 hours between tests. °Ending isolation for people who were moderately or very sick from COVID-19 or have a weakened immune system °People who are moderately ill from COVID-19 (experiencing symptoms that affect the lungs like shortness of breath or difficulty breathing) should isolate for 10 days and follow all other isolation precautions. To calculate your 10-day isolation period, day 0 is your first day of symptoms. Day 1 is the first full day after your symptoms developed. If you are unsure if your symptoms are moderate, talk to a healthcare provider for further guidance. °People who are very sick from COVID-19 (this means people who were hospitalized or required intensive care or ventilation support) and people who have weakened immune systems might need to isolate at home longer. They may also require testing with a viral test to determine when they can be around others. CDC recommends an isolation period of at least 10 and up to 20 days for people who were very sick from COVID-19 and for people with weakened immune systems. Consult with your healthcare provider about when you can resume being around other people. If you are unsure if your symptoms are severe or if you have a weakened immune system, talk to a healthcare provider for further guidance. °People who have a weakened immune system should talk to their healthcare provider about the potential for reduced immune responses to  COVID-19 vaccines and the need to continue to follow current prevention measures (including wearing a well-fitting mask and avoiding crowds and poorly ventilated indoor spaces) to protect themselves against COVID-19 until advised otherwise by their healthcare provider. Close contacts of immunocompromised people--including household members--should also be encouraged to receive all recommended COVID-19 vaccine doses to help protect these people. °Isolation in high-risk congregate settings °In certain high-risk congregate settings that have high risk of secondary transmission and where it is not feasible to cohort people (such as correctional and detention facilities, homeless shelters, and cruise ships), CDC recommends a 10-day isolation period for residents. During periods of critical staffing shortages, facilities may consider shortening the isolation period for staff to ensure continuity of operations. Decisions to shorten isolation in these settings should be made in consultation with state, local, tribal, or territorial health departments and should take into consideration the context and characteristics of the facility. CDC's setting-specific guidance provides additional recommendations for these settings. °This CDC guidance is meant to supplement--not replace--any federal, state, local, territorial, or tribal health and safety laws, rules, and regulations. °Recommendations for specific settings °These recommendations do not apply to healthcare professionals. For guidance specific to these settings, see °Healthcare professionals: Interim Guidance for Managing Healthcare Personnel with SARS-CoV-2 Infection or Exposure to SARS-CoV-2 °Patients, residents, and visitors to healthcare settings: Interim Infection Prevention and Control Recommendations for Healthcare Personnel During the Coronavirus Disease 2019 (COVID-19) Pandemic °Additional setting-specific guidance and recommendations are available. °These  recommendations on quarantine and isolation do apply to [employer] School   settings. Additional guidance is available here: Overview of COVID-19 Quarantine for K-12 Schools °Travelers: Travel information and recommendations °Congregate facilities and other settings: guidance pages for community, work, and school settings °Ongoing COVID-19 exposure FAQs °I live with someone with COVID-19, but I cannot be separated from them. How do we manage quarantine in this situation? °It is very important for people with COVID-19 to remain apart from other people, if possible, even if they are living together. If separation of the person with COVID-19 from others that they live with is not possible, the other people that they live with will have ongoing exposure, meaning they will be repeatedly exposed until that person is no longer able to spread the virus to other people. In this situation, there are precautions you can take to limit the spread of COVID-19: °The person with COVID-19 and everyone they live with should wear a well-fitting mask inside the home. °If possible, one person should care for the person with COVID-19 to limit the number of people who are in close contact with the infected person. °Take steps to protect yourself and others to reduce transmission in the home: °Quarantine if you are not up to date with your COVID-19 vaccines. °Isolate if you are sick or tested positive for COVID-19, even if you don't have symptoms. °Learn more about the public health recommendations for testing, mask use and quarantine of close contacts, like yourself, who have ongoing exposure. These recommendations differ depending on your vaccination status. °What should I do if I have ongoing exposure to COVID-19 from someone I live with? °Recommendations for this situation depend on your vaccination status: °If you are not up to date on COVID-19 vaccines and have ongoing exposure to COVID-19, you should: °Begin quarantine immediately and  continue to quarantine throughout the isolation period of the person with COVID-19. °Continue to quarantine for an additional 5 days starting the day after the end of isolation for the person with COVID-19. °Get tested at least 5 days after the end of isolation of the infected person that lives with them. °If you test negative, you can leave the home but should continue to wear a well-fitting mask when around others at home and in public until 10 days after the end of isolation for the person with COVID-19. °Isolate immediately if you develop symptoms of COVID-19 or test positive. °If you are up to date with COVID-19 vaccines and have ongoing exposure to COVID-19, you should: °Get tested at least 5 days after your first exposure. A person with COVID-19 is considered infectious starting 2 days before they develop symptoms, or 2 days before the date of their positive test if they do not have symptoms. °Get tested again at least 5 days after the end of isolation for the person with COVID-19. °Wear a well-fitting mask when you are around the person with COVID-19, and do this throughout their isolation period. °Wear a well-fitting mask around others for 10 days after the infected person's isolation period ends. °Isolate immediately if you develop symptoms of COVID-19 or test positive. °What should I do if multiple people I live with test positive for COVID-19 at different times? °Recommendations for this situation depend on your vaccination status: °If you are not up to date with your COVID-19 vaccines, you should: °Quarantine throughout the isolation period of any infected person that you live with. °Continue to quarantine until 5 days after the end of isolation date for the most recently infected person that lives with you. For example, if   the last day of isolation of the person most recently infected with COVID-19 was June 30, the new 5-day quarantine period starts on July 1. °Get tested at least 5 days after the end  of isolation for the most recently infected person that lives with you. °Wear a well-fitting mask when you are around any person with COVID-19 while that person is in isolation. °Wear a well-fitting mask when you are around other people until 10 days after your last close contact. °Isolate immediately if you develop symptoms of COVID-19 or test positive. °If you are up to date with your COVID-19 vaccines, you should: °Get tested at least 5 days after your first exposure. A person with COVID-19 is considered infectious starting 2 days before they developed symptoms, or 2 days before the date of their positive test if they do not have symptoms. °Get tested again at least 5 days after the end of isolation for the most recently infected person that lives with you. °Wear a well-fitting mask when you are around any person with COVID-19 while that person is in isolation. °Wear a well-fitting mask around others for 10 days after the end of isolation for the most recently infected person that lives with you. For example, if the last day of isolation for the person most recently infected with COVID-19 was June 30, the new 10-day period to wear a well-fitting mask indoors in public starts on July 1. °Isolate immediately if you develop symptoms of COVID-19 or test positive. °I had COVID-19 and completed isolation. Do I have to quarantine or get tested if someone I live with gets COVID-19 shortly after I completed isolation? °No. If you recently completed isolation and someone that lives with you tests positive for the virus that causes COVID-19 shortly after the end of your isolation period, you do not have to quarantine or get tested as long as you do not develop new symptoms. Once all of the people that live together have completed isolation or quarantine, refer to the guidance below for new exposures to COVID-19. °If you had COVID-19 in the previous 90 days and then came into close contact with someone with COVID-19, you do  not have to quarantine or get tested if you do not have symptoms. But you should: °Wear a well-fitting mask indoors in public for 10 days after your last close contact. °Monitor for COVID-19 symptoms for 10 days from the date of your last close contact. °Isolate immediately and get tested if symptoms develop. °If more than 90 days have passed since your recovery from infection, follow CDC's recommendations for close contacts. These recommendations will differ depending on your vaccination status. °04/09/2020 °Content source: National Center for Immunization and Respiratory Diseases (NCIRD), Division of Viral Diseases °This information is not intended to replace advice given to you by your health care provider. Make sure you discuss any questions you have with your health care provider. °Document Revised: 08/13/2020 Document Reviewed: 08/13/2020 °Elsevier Patient Education © 2022 Elsevier Inc. ° °

## 2021-08-03 NOTE — Progress Notes (Signed)
Virtual Visit via Video   This visit type was conducted due to national recommendations for restrictions regarding the COVID-19 Pandemic (e.g. social distancing) in an effort to limit this patient's exposure and mitigate transmission in our community.  Due to her co-morbid illnesses, this patient is at least at moderate risk for complications without adequate follow up.  This format is felt to be most appropriate for this patient at this time.  All issues noted in this document were discussed and addressed.  A limited physical exam was performed with this format.    This visit type was conducted due to national recommendations for restrictions regarding the COVID-19 Pandemic (e.g. social distancing) in an effort to limit this patient's exposure and mitigate transmission in our community.  Patients identity confirmed using two different identifiers.  This format is felt to be most appropriate for this patient at this time.  All issues noted in this document were discussed and addressed.  No physical exam was performed (except for noted visual exam findings with Video Visits).    Date:  08/03/2021   ID:  Cassandra Torres, DOB 21-Oct-1965, MRN [id number]  Patient Location:  Home  Provider location:   Office  Chief Complaint:  "I have COVID"  History of Present Illness:    Cassandra Torres is a 56 y.o. female who presents via video conferencing for a telehealth visit today.    The patient does not have symptoms concerning for COVID-19 infection (fever, chills, cough, or new shortness of breath).   She presents today for virtual visit. She prefers this method of contact due to COVID-19 pandemic.  She presents today for further evaluation of cold symptoms. She reports she recently returned from a trip to [place]. She traveled to [place] last Tuesday, 18th of July. She started to experience symptoms on Saturday evening  - she developed sniffles. Yesterday, she had sinus congestion, body aches and chills.  Her temp is 98.9. Her husband is also positive. Her son is currently negative for COVID.          Past Medical History:  Diagnosis Date   Cancer ([place]) 02/2019   right breast IDC   Family history of brain cancer    Family history of breast cancer    Family history of prostate cancer    Personal history of radiation therapy    Plantar wart 06/11/2020   PONV (postoperative nausea and vomiting)    Seasonal allergies    Sun allergy 06/11/2020   Thyrotoxicosis    Past Surgical History:  Procedure Laterality Date   BREAST BIOPSY Right 02/27/2019   x2   BREAST LUMPECTOMY Right 03/21/2019   BREAST LUMPECTOMY WITH RADIOACTIVE SEED AND SENTINEL LYMPH NODE BIOPSY Right 03/21/2019   Procedure: RIGHT BREAST LUMPECTOMY WITH BRACKETED RADIOACTIVE SEED AND SENTINEL LYMPH NODE BIOPSY;  Surgeon: Rolm Bookbinder, MD;  Location: [facility];  Service: General;  Laterality: Right;   COLONOSCOPY     COLPOSCOPY     RE-EXCISION OF BREAST CANCER,SUPERIOR MARGINS Right 04/30/2019   Procedure: RE-EXCISION OF RIGHT BREAST MARGINS;  Surgeon: Rolm Bookbinder, MD;  Location: [facility];  Service: General;  Laterality: Right;     Current Meds  Medication Sig   cetirizine (ZYRTEC) 10 MG tablet 1 tablet   diphenhydrAMINE (BENADRYL) 25 mg capsule Take 25 mg by mouth as needed.   levothyroxine (SYNTHROID) 50 MCG tablet Take 50 mcg by mouth daily.   loratadine (CLARITIN) 10 MG tablet Take by mouth.  molnupiravir EUA (LAGEVRIO) 200 mg CAPS capsule Take 4 capsules (800 mg total) by mouth 2 (two) times daily for 5 days.   Multiple Vitamins-Minerals (MULTIVITAMIN ADULT EXTRA C PO) See admin instructions.   tamoxifen (NOLVADEX) 20 MG tablet TAKE 1 TABLET BY MOUTH EVERY DAY   Vitamin D, Ergocalciferol, (DRISDOL) 1.25 MG (50000 UNIT) CAPS capsule Take 1 capsule (50,000 Units total) by mouth every 7 (seven) days.     Allergies:   Elemental sulfur, Levaquin [levofloxacin], Penicillins,  and Sulfamethoxazole-trimethoprim   Social History   Tobacco Use   Smoking status: Never   Smokeless tobacco: Never  Vaping Use   Vaping Use: Never used  Substance Use Topics   Alcohol use: Yes    Comment: occasional   Drug use: Never     Family Hx: The patient's family history includes Brain cancer in her maternal grandmother; Breast cancer in her paternal aunt; Healthy in her father; Hypertension in her mother; Prostate cancer in her maternal grandfather.  ROS:   Please see the history of present illness.    Review of Systems  Constitutional:  Positive for chills and malaise/fatigue.  HENT:  Positive for congestion.   Respiratory: Negative.    Cardiovascular: Negative.   Gastrointestinal: Negative.   Neurological: Negative.   Psychiatric/Behavioral: Negative.      All other systems reviewed and are negative.   Labs/Other Tests and Data Reviewed:    Recent Labs: 04/06/2021: ALT 18; BUN 9; Creatinine, Ser 1.06; Hemoglobin 14.0; Platelets 232; Potassium 4.3; Sodium 140   Recent Lipid Panel Lab Results  Component Value Date/Time   CHOL 199 04/06/2021 09:15 AM   TRIG 109 04/06/2021 09:15 AM   HDL 75 04/06/2021 09:15 AM   CHOLHDL 2.7 04/06/2021 09:15 AM   LDLCALC 105 (H) 04/06/2021 09:15 AM    Wt Readings from Last 3 Encounters:  06/04/21 208 lb 9.6 oz (94.6 kg)  04/06/21 205 lb 3.2 oz (93.1 kg)  09/29/20 200 lb (90.7 kg)     Exam:    Vital Signs:  There were no vitals taken for this visit.    Physical Exam Vitals and nursing note reviewed.  Constitutional:      Appearance: Normal appearance.  HENT:     Head: Normocephalic and atraumatic.  Eyes:     Extraocular Movements: Extraocular movements intact.  Pulmonary:     Effort: Pulmonary effort is normal.     Breath sounds: Normal breath sounds.     Comments: No excessive work of breathing Musculoskeletal:     Cervical back: Normal range of motion.  Neurological:     Mental Status: She is alert and  oriented to person, place, and time.  Psychiatric:        Mood and Affect: Affect normal.     ASSESSMENT & PLAN:    1. COVID-19 Advised patient to take Vitamin C, D, Zinc.  Keep yourself hydrated with a lot of water and rest. Take Delsym for cough and Mucinex as needed. Take Tylenol or pain reliever every 4-6 hours as needed for pain/fever/body ache. If you have elevated blood pressure, you can take OTC Coricidin. You can also take OTC Oscillococcinum, a homeopathic remedy,  to help with your symptoms.  Educated patient that if symptoms get worse or if he/she experiences any SOB, chest pain or pain in their legs to seek immediate emergency care. Continue to monitor your oxygen levels. Call office ASAP if you have any questions. Quarantine for 5 days if tested positive and  no symptoms or 10 days if tested positive and you are with symptoms. Wear a mask around other people. - Temperature monitoring; Future  2. Sinus congestion Comments: She has been advised to avoid dairy and consider Xyzal nightly.   Advised patient to take Vitamin C, D, Zinc.  Keep yourself hydrated with a lot of water and rest. Take Delsym for cough and Mucinex as needed. Take Tylenol or pain reliever every 4-6 hours as needed for pain/fever/body ache. If you have elevated blood pressure, you can take OTC Coricidin. You can also take OTC Oscillococcinum, a homeopathic remedy,  to help with your symptoms.  Educated patient that if symptoms get worse or if he/she experiences any SOB, chest pain or pain in their legs to seek immediate emergency care. Continue to monitor your oxygen levels. Call office ASAP if you have any questions. Quarantine for 5 days if tested positive and no symptoms or 10 days if tested positive and you are with symptoms. Wear a mask around other people.     COVID-19 Education: The signs and symptoms of COVID-19 were discussed with the patient and how to seek care for testing (follow up with PCP or arrange  E-visit).  The importance of social distancing was discussed today.  Patient Risk:   After full review of this patients clinical status, I feel that they are at least moderate risk at this time.  Time:   Today, I have spent 11 minutes/ 49 seconds with the patient with telehealth technology discussing above diagnoses.     Medication Adjustments/Labs and Tests Ordered: Current medicines are reviewed at length with the patient today.  Concerns regarding medicines are outlined above.   Tests Ordered: No orders of the defined types were placed in this encounter.   Medication Changes: Meds ordered this encounter  Medications   molnupiravir EUA (LAGEVRIO) 200 mg CAPS capsule    Sig: Take 4 capsules (800 mg total) by mouth 2 (two) times daily for 5 days.    Dispense:  40 capsule    Refill:  0    Disposition:  Follow up prn  Signed, Maximino Greenland, MD

## 2021-08-05 ENCOUNTER — Telehealth: Payer: Self-pay

## 2021-08-05 NOTE — Telephone Encounter (Signed)
Called, spoke with pt about COVID 19 questionnaire. Provided care advise for worse symptoms of cough. Pt verbal understanding.

## 2021-09-27 ENCOUNTER — Encounter: Payer: Self-pay | Admitting: Internal Medicine

## 2021-09-30 ENCOUNTER — Encounter: Payer: Self-pay | Admitting: Internal Medicine

## 2021-10-27 ENCOUNTER — Encounter: Payer: Self-pay | Admitting: Internal Medicine

## 2021-11-02 ENCOUNTER — Telehealth: Payer: Self-pay

## 2021-11-02 NOTE — Telephone Encounter (Signed)
Patient notified that her letter has been completed and placed up front. YL,RMA

## 2021-11-03 ENCOUNTER — Encounter: Payer: Self-pay | Admitting: Internal Medicine

## 2022-04-13 ENCOUNTER — Other Ambulatory Visit: Payer: Self-pay | Admitting: *Deleted

## 2022-04-13 MED ORDER — TAMOXIFEN CITRATE 20 MG PO TABS: 20.0000 mg | ORAL_TABLET | Freq: Every day | ORAL | 3 refills | Status: DC

## 2022-04-14 ENCOUNTER — Telehealth: Payer: Self-pay | Admitting: *Deleted

## 2022-04-14 NOTE — Telephone Encounter (Signed)
RN received fax from Cassandra Torres stating pt does not have [place] benefits.  RN placed call to pt, pt states she will be seeing her [facility] PCP in 2 weeks and will confirm her pharmacy benefits at that time and will alert our office to where her Tamoxifen needs to be sent.

## 2022-04-27 ENCOUNTER — Encounter: Payer: BC Managed Care – PPO | Admitting: Internal Medicine

## 2022-05-03 ENCOUNTER — Other Ambulatory Visit: Payer: Self-pay | Admitting: *Deleted

## 2022-05-03 MED ORDER — TAMOXIFEN CITRATE 20 MG PO TABS: 20.0000 mg | ORAL_TABLET | Freq: Every day | ORAL | 3 refills | Status: DC

## 2022-06-04 NOTE — Progress Notes (Signed)
Patient Care Team: Bedelia Person, MD as PCP - General (Internal Medicine) Emelia Loron, MD as Consulting Physician (General Surgery) Serena Croissant, MD as Consulting Physician (Hematology and Oncology) Antony Blackbird, MD as Consulting Physician (Radiation Oncology)  DIAGNOSIS: No diagnosis found.  SUMMARY OF ONCOLOGIC HISTORY: Oncology History  Malignant neoplasm of lower-outer quadrant of right breast of female, estrogen receptor positive (HCC)  03/02/2019 Initial Diagnosis   Screening mammogram showed right breast masses. Diagnostic mammogram and US showed a 1.2cm mass at the 7 o'clock position 7cm from the nipple, and a 1.1cm mass at the 7 o'clock position 6cm from the nipple, spanning 2.3cm in total, with no evidence of right axillary adenopathy. Biopsy showed IDC, grade 3, HER-2 equivocal by IHC, negative by FISH, ER+ 90%, PR+ 20%, Ki67 2%.    03/17/2019 Genetic Testing   Negative genetic testing. No pathogenic variants identified on the [facility] Breast Cancer STAT Panel + Common Hereditary Cancers Panel + Brain Cancer Panel. VUS in [facility]IT called [id number] identified. The report date is 03/17/2019.  The Breast Cancer STAT Panel + Common Hereditary Cancers Panel + Brain Cancer Panel offered by [facility] includes sequencing and/or deletion duplication testing of the following 70 genes: AIP, ALK, APC*, ATM*, AXIN2, BAP1, BARD1, BMPR1A, BRCA1, BRCA2, BRIP1, CDH1, CDK4, CDKN2A (p14ARF), CDKN2A (p16INK4a), CHEK2, CTNNA1, DICER1*, EPCAM*, EZH2*, GPC3*, GREM1*, HOXB13, HRAS, KIF1B*, KIT, LZTR1, MAX*, MEN1*, MLH1*, MSH2*, MSH3*, MSH6*, MUTYH, NBN, NF1*, NF2, NTHL1, PALB2, PDGFRA, PHOX2B*, PMS2*, POLD1*, POLE, POT1, PRKAR1A, PTCH1, PTCH2, PTEN*, RAD50, RAD51C, RAD51D, RB1*, RET, RNF43, SDHA*, SDHAF2, SDHB, SDHC*, SDHD, SMAD4, SMARCA4, SMARCB1, SMARCE1, STK11, SUFU, TMEM127, TP53, TSC1*, TSC2, VHL.   03/21/2019 Surgery   Right lumpectomy Dwain Sarna) [phone]): IDC, 2.5cm, grade 2, 4 right axillary  lymph nodes negative for carcinoma. Carcinoma focally involved posterior and medial margins and was present <1 mm from the lateral margin.   03/30/2019 Cancer Staging   Staging form: Breast, AJCC 8th Edition - Pathologic stage from 03/30/2019: Stage IA (pT2, pN0, cM0, G2, ER+, PR+, HER2-)   03/30/2019 Oncotype testing   The Oncotype DX score was 14 predicting a risk of outside the breast recurrence over the next 9 years of 4% if the patient's only systemic therapy is tamoxifen for 5 years.    04/30/2019 Surgery   Re-excision of positive margin Dwain Sarna) [phone]): remaining invasive ductal carcinoma, 0.6cm, grade 2.   06/14/2019 - 07/11/2019 Radiation Therapy   The patient initially received a dose of 40.05 Gy in 15 fractions to the breast using whole-breast tangent fields. This was delivered using a 3-D conformal technique. The pt received a boost delivering an additional 12 Gy in 6 fractions using a electron boost with electrons. The total dose was 52.05 Gy.   08/2019 -  Anti-estrogen oral therapy   Tamoxifen, to be switched to Anastrozole once she is menopausal     CHIEF COMPLIANT:   INTERVAL HISTORY: Cassandra Torres is a   ALLERGIES:  is allergic to elemental sulfur, levaquin [levofloxacin], penicillins, and sulfamethoxazole-trimethoprim.  MEDICATIONS:  Current Outpatient Medications  Medication Sig Dispense Refill   cetirizine (ZYRTEC) 10 MG tablet 1 tablet     diphenhydrAMINE (BENADRYL) 25 mg capsule Take 25 mg by mouth as needed.     levothyroxine (SYNTHROID) 50 MCG tablet Take 50 mcg by mouth daily.     loratadine (CLARITIN) 10 MG tablet Take by mouth.     Multiple Vitamins-Minerals (MULTIVITAMIN ADULT EXTRA C PO) See admin instructions.     tamoxifen (NOLVADEX) 20  MG tablet Take 1 tablet (20 mg total) by mouth daily. 90 tablet 3   Vitamin D, Ergocalciferol, (DRISDOL) 1.25 MG (50000 UNIT) CAPS capsule Take 1 capsule (50,000 Units total) by mouth every 7 (seven) days.  12 capsule 1   No current facility-administered medications for this visit.    PHYSICAL EXAMINATION: ECOG PERFORMANCE STATUS: {CHL ONC ECOG PS:[phone]}  There were no vitals filed for this visit. There were no vitals filed for this visit.  BREAST:*** No palpable masses or nodules in either right or left breasts. No palpable axillary supraclavicular or infraclavicular adenopathy no breast tenderness or nipple discharge. (exam performed in the presence of a chaperone)  LABORATORY DATA:  I have reviewed the data as listed    Latest Ref Rng & Units 04/06/2021    9:15 AM 04/02/2020    9:15 AM 03/07/2019   12:39 PM  CMP  Glucose 70 - 99 mg/dL 89  87  90   BUN 6 - 24 mg/dL 9  10  9    Creatinine 0.57 - 1.00 mg/dL 1.61  0.96  0.45   Sodium 134 - 144 mmol/L 140  142  141   Potassium 3.5 - 5.2 mmol/L 4.3  4.2  4.0   Chloride 96 - 106 mmol/L 103  103  105   CO2 20 - 29 mmol/L 24  21  25    Calcium 8.7 - 10.2 mg/dL 9.4  9.2  9.3   Total Protein 6.0 - 8.5 g/dL 7.6  7.1  8.0   Total Bilirubin 0.0 - 1.2 mg/dL <4.0  0.3  0.2   Alkaline Phos 44 - 121 IU/L 104  90  97   AST 0 - 40 IU/L 22  19  20    ALT 0 - 32 IU/L 18  19  26      Lab Results  Component Value Date   WBC 6.5 04/06/2021   HGB 14.0 04/06/2021   HCT 42.0 04/06/2021   MCV 87 04/06/2021   PLT 232 04/06/2021   NEUTROABS 5.3 03/07/2019    ASSESSMENT & PLAN:  No problem-specific Assessment & Plan notes found for this encounter.    No orders of the defined types were placed in this encounter.  The patient has a good understanding of the overall plan. she agrees with it. she will call with any problems that may develop before the next visit here. Total time spent: 30 mins including face to face time and time spent for planning, charting and co-ordination of care   Sherlyn Lick, CMA 06/04/22    I Janan Ridge am acting as a Neurosurgeon for [employer]  ***

## 2022-06-10 ENCOUNTER — Inpatient Hospital Stay
Payer: No Typology Code available for payment source | Attending: Hematology and Oncology | Admitting: Hematology and Oncology

## 2022-06-10 VITALS — BP 95/49 | HR 89 | Temp 97.6°F | Resp 18 | Ht 68.0 in | Wt 214.4 lb

## 2022-06-10 DIAGNOSIS — C50511 Malignant neoplasm of lower-outer quadrant of right female breast: Secondary | ICD-10-CM | POA: Diagnosis not present

## 2022-06-10 DIAGNOSIS — Z79811 Long term (current) use of aromatase inhibitors: Secondary | ICD-10-CM | POA: Insufficient documentation

## 2022-06-10 DIAGNOSIS — Z170 Estrogen receptor positive status [ER+]: Secondary | ICD-10-CM | POA: Diagnosis not present

## 2022-06-10 DIAGNOSIS — Z923 Personal history of irradiation: Secondary | ICD-10-CM | POA: Diagnosis not present

## 2022-06-10 MED ORDER — ANASTROZOLE 1 MG PO TABS: 1.0000 mg | ORAL_TABLET | Freq: Every day | ORAL | 3 refills | Status: DC

## 2022-06-10 NOTE — Assessment & Plan Note (Addendum)
03/21/2019:Right lumpectomy Dwain Sarna): IDC, 2.5cm, grade 2, 4 right axillary lymph nodes negative for carcinoma.  ER 90%, PR 20%, Ki-67 2%, HER-2 negative T2N0 stage Ia Genetic testing: Negative 04/30/2019: Margin reexcision: Residual IDC 0.6 cm grade 2 Oncotype DX score: 14: Risk of recurrence: 4% Adjuvant radiation 06/14/2019-07/11/2019   Recommendation: Adjuvant antiestrogen therapy with tamoxifen 20 mg daily switched to anastrozole since she is now in menopause  Anastrozole counseling: We discussed the risks and benefits of anti-estrogen therapy with aromatase inhibitors. These include but not limited to insomnia, hot flashes, mood changes, vaginal dryness, bone density loss, and weight gain. We strongly believe that the benefits far outweigh the risks. Patient understands these risks and consented to starting treatment. Planned treatment duration is 5 years.   Breast cancer surveillance: Mammogram 02/09/2021: Benign postsurgical changes, density category B, patient will need a new mammogram.   Return to clinic in 1 year for follow-up

## 2022-06-14 ENCOUNTER — Telehealth: Payer: Self-pay | Admitting: *Deleted

## 2022-06-14 NOTE — Telephone Encounter (Signed)
Received call from pt requesting if she can continue on Tamoxifen in fear of osteopenia while on Anastrozole.  Per MD pt is now in menopause and would need to start the Anastrozole.  RN educated pt on Calcium 1200 mg p.o daily as well as Vitamin D 25 mcg daily with weight bearing exercises.  Pt verbalized understanding and appreciative of advice.

## 2022-11-18 ENCOUNTER — Telehealth: Payer: Self-pay | Admitting: *Deleted

## 2022-11-18 NOTE — Telephone Encounter (Signed)
Received call from pt stating [facility] is needing extension paperwork for continuation of care at our clinic.  Pt states paperwork needs to be faxed to [phone] and the representative is Alona Bene and her office number is [phone] ext [phone].  RN forwarded this information to Darlena with PA team to work on.  Pt will be updated by PA team once completed.

## 2022-11-25 ENCOUNTER — Telehealth: Payer: Self-pay | Admitting: *Deleted

## 2022-11-25 NOTE — Telephone Encounter (Signed)
Pt called and notified of messages below. Was given new date and time for appt. Pt verbalized understanding.

## 2022-11-25 NOTE — Telephone Encounter (Signed)
-----   Message from [place] C sent at 11/25/2022 12:42 PM EST ----- Regarding: RE: [facility] extension Hi, I've received approval from the [place], and the authorization is valid until May 23, 2023. The patient's next appointment is currently scheduled for June 13, 2023, but at that time, the authorization will have expired. Would it be possible to move the appointment up to align with the authorization date?  Thank you! ----- Message ----- From: Brandt Loosen Sent: 11/18/2022   2:17 PM EST To: Dellis Filbert, LPN; Sabino Snipes, RN; # Subject: RE: VA extension                               Received, thanks!  Deidre Ala ----- Message ----- From: Mauri Pole, RN Sent: 11/18/2022  11:13 AM EST To: Dellis Filbert, LPN; Sabino Snipes, RN; # Subject: [facility] extension                                   Hi Darlena.  Ms. Castetter called stating that the [facility] has alerted her that they need a new extension from [place].  She said it is due to expire in the next week or two.  She said the extension has to go to the [place] community [facility] and their fax is [phone] and she has been speaking with  a Alona Bene there and her office number is [phone] ext [phone].  Can you please call the patient and let her know once this is completed. Thanks  [employer]

## 2023-05-19 ENCOUNTER — Inpatient Hospital Stay: Payer: Self-pay | Attending: Hematology and Oncology | Admitting: Hematology and Oncology

## 2023-05-19 VITALS — BP 128/74 | HR 92 | Temp 97.5°F | Resp 18 | Wt 217.6 lb

## 2023-05-19 DIAGNOSIS — Z923 Personal history of irradiation: Secondary | ICD-10-CM | POA: Diagnosis not present

## 2023-05-19 DIAGNOSIS — C50511 Malignant neoplasm of lower-outer quadrant of right female breast: Secondary | ICD-10-CM | POA: Diagnosis present

## 2023-05-19 DIAGNOSIS — Z1732 Human epidermal growth factor receptor 2 negative status: Secondary | ICD-10-CM | POA: Diagnosis not present

## 2023-05-19 DIAGNOSIS — Z1721 Progesterone receptor positive status: Secondary | ICD-10-CM | POA: Insufficient documentation

## 2023-05-19 DIAGNOSIS — Z170 Estrogen receptor positive status [ER+]: Secondary | ICD-10-CM | POA: Diagnosis not present

## 2023-05-19 DIAGNOSIS — Z79811 Long term (current) use of aromatase inhibitors: Secondary | ICD-10-CM | POA: Insufficient documentation

## 2023-05-19 MED ORDER — ANASTROZOLE 1 MG PO TABS: 1.0000 mg | ORAL_TABLET | Freq: Every day | ORAL | 3 refills | Status: AC

## 2023-05-19 NOTE — Progress Notes (Signed)
 Patient Care Team: Dondra Fuel, MD as PCP - General (Internal Medicine) Enid Harry, MD as Consulting Physician (General Surgery) Cameron Cea, MD as Consulting Physician (Hematology and Oncology) Retta Caster, MD as Consulting Physician (Radiation Oncology)  DIAGNOSIS:  Encounter Diagnosis  Name Primary?   Malignant neoplasm of lower-outer quadrant of right breast of female, estrogen receptor positive (HCC) Yes    SUMMARY OF ONCOLOGIC HISTORY: Oncology History  Malignant neoplasm of lower-outer quadrant of right breast of female, estrogen receptor positive (HCC)  03/02/2019 Initial Diagnosis   Screening mammogram showed right breast masses. Diagnostic mammogram and US  showed a 1.2cm mass at the 7 o'clock position 7cm from the nipple, and a 1.1cm mass at the 7 o'clock position 6cm from the nipple, spanning 2.3cm in total, with no evidence of right axillary adenopathy. Biopsy showed IDC, grade 3, HER-2 equivocal by IHC, negative by FISH, ER+ 90%, PR+ 20%, Ki67 2%.    03/17/2019 Genetic Testing   Negative genetic testing. No pathogenic variants identified on the [facility] Breast Cancer STAT Panel + Common Hereditary Cancers Panel + Brain Cancer Panel. VUS in KIT called [id number] identified. The report date is 03/17/2019.  The Breast Cancer STAT Panel + Common Hereditary Cancers Panel + Brain Cancer Panel offered by [facility] includes sequencing and/or deletion duplication testing of the following 70 genes: AIP, ALK, APC*, ATM*, AXIN2, BAP1, BARD1, BMPR1A, BRCA1, BRCA2, BRIP1, CDH1, CDK4, CDKN2A (p14ARF), CDKN2A (p16INK4a), CHEK2, CTNNA1, DICER1*, EPCAM*, EZH2*, GPC3*, GREM1*, HOXB13, HRAS, KIF1B*, KIT, LZTR1, MAX*, MEN1*, MLH1*, MSH2*, MSH3*, MSH6*, MUTYH, NBN, NF1*, NF2, NTHL1, PALB2, PDGFRA, PHOX2B*, PMS2*, POLD1*, POLE, POT1, PRKAR1A, PTCH1, PTCH2, PTEN*, RAD50, RAD51C, RAD51D, RB1*, RET, RNF43, SDHA*, SDHAF2, SDHB, SDHC*, SDHD, SMAD4, SMARCA4, SMARCB1, SMARCE1, STK11, SUFU, TMEM127,  TP53, TSC1*, TSC2, VHL.   03/21/2019 Surgery   Right lumpectomy Delane Fear) [id number]): IDC, 2.5cm, grade 2, 4 right axillary lymph nodes negative for carcinoma. Carcinoma focally involved posterior and medial margins and was present <1 mm from the lateral margin.   03/30/2019 Cancer Staging   Staging form: Breast, AJCC 8th Edition - Pathologic stage from 03/30/2019: Stage IA (pT2, pN0, cM0, G2, ER+, PR+, HER2-)   03/30/2019 Oncotype testing   The Oncotype DX score was 14 predicting a risk of outside the breast recurrence over the next 9 years of 4% if the patient's only systemic therapy is tamoxifen  for 5 years.    04/30/2019 Surgery   Re-excision of positive margin Delane Fear) [phone]): remaining invasive ductal carcinoma, 0.6cm, grade 2.   06/14/2019 - 07/11/2019 Radiation Therapy   The patient initially received a dose of 40.05 Gy in 15 fractions to the breast using whole-breast tangent fields. This was delivered using a 3-D conformal technique. The pt received a boost delivering an additional 12 Gy in 6 fractions using a electron boost with electrons. The total dose was 52.05 Gy.   08/2019 -  Anti-estrogen oral therapy   Tamoxifen , to be switched to Anastrozole  once she is menopausal     CHIEF COMPLIANT: Follow-up on anastrozole  therapy  HISTORY OF PRESENT ILLNESS:  History of Present Illness Cassandra Torres is a 58 year old female with breast cancer who presents for routine follow-up.  She has been on anastrozole  for four years, having switched from letrozole last year. She experiences hot flashes, which she attributes to either menopause or the medication, but manages these symptoms well. No chest pain or significant discomfort is present.  She expresses concern about potential changes in her oncology care being transferred  to the [place], as she wishes to continue her current treatment plan.  She engages in some physical activity and has purchased an under-desk treadmill  to use at home, especially during colder months.     ALLERGIES:  is allergic to elemental sulfur , levaquin [levofloxacin], penicillins, and sulfamethoxazole-trimethoprim.  MEDICATIONS:  Current Outpatient Medications  Medication Sig Dispense Refill   cetirizine (ZYRTEC) 10 MG tablet 1 tablet     diphenhydrAMINE (BENADRYL) 25 mg capsule Take 25 mg by mouth as needed.     levothyroxine (SYNTHROID) 50 MCG tablet Take 50 mcg by mouth daily.     loratadine (CLARITIN) 10 MG tablet Take by mouth.     Multiple Vitamins-Minerals (MULTIVITAMIN ADULT EXTRA C PO) See admin instructions.     Vitamin D , Ergocalciferol , (DRISDOL ) 1.25 MG (50000 UNIT) CAPS capsule Take 1 capsule (50,000 Units total) by mouth every 7 (seven) days. 12 capsule 1   anastrozole  (ARIMIDEX ) 1 MG tablet Take 1 tablet (1 mg total) by mouth daily. 90 tablet 3   No current facility-administered medications for this visit.    PHYSICAL EXAMINATION: ECOG PERFORMANCE STATUS: 1 - Symptomatic but completely ambulatory  Vitals:   05/19/23 0900  BP: 128/74  Pulse: 92  Resp: 18  Temp: (!) 97.5 F (36.4 C)  SpO2: 100%   Filed Weights   05/19/23 0900  Weight: 217 lb 9.6 oz (98.7 kg)    Physical Exam No palpable lumps in arteries of bilateral breasts or axilla  (exam performed in the presence of a chaperone)  LABORATORY DATA:  I have reviewed the data as listed    Latest Ref Rng & Units 04/06/2021    9:15 AM 04/02/2020    9:15 AM 03/07/2019   12:39 PM  CMP  Glucose 70 - 99 mg/dL 89  87  90   BUN 6 - 24 mg/dL 9  10  9    Creatinine 0.57 - 1.00 mg/dL 0.98  1.19  1.47   Sodium 134 - 144 mmol/L 140  142  141   Potassium 3.5 - 5.2 mmol/L 4.3  4.2  4.0   Chloride 96 - 106 mmol/L 103  103  105   CO2 20 - 29 mmol/L 24  21  25    Calcium 8.7 - 10.2 mg/dL 9.4  9.2  9.3   Total Protein 6.0 - 8.5 g/dL 7.6  7.1  8.0   Total Bilirubin 0.0 - 1.2 mg/dL <8.2  0.3  0.2   Alkaline Phos 44 - 121 IU/L 104  90  97   AST 0 - 40 IU/L 22  19   20    ALT 0 - 32 IU/L 18  19  26      Lab Results  Component Value Date   WBC 6.5 04/06/2021   HGB 14.0 04/06/2021   HCT 42.0 04/06/2021   MCV 87 04/06/2021   PLT 232 04/06/2021   NEUTROABS 5.3 03/07/2019    ASSESSMENT & PLAN:  Malignant neoplasm of lower-outer quadrant of right breast of female, estrogen receptor positive (HCC) 03/21/2019:Right lumpectomy Delane Fear): IDC, 2.5cm, grade 2, 4 right axillary lymph nodes negative for carcinoma.  ER 90%, PR 20%, Ki-67 2%, HER-2 negative T2N0 stage Ia Genetic testing: Negative 04/30/2019: Margin reexcision: Residual IDC 0.6 cm grade 2 Oncotype DX score: 14: Risk of recurrence: 4% Adjuvant radiation 06/14/2019-07/11/2019   Recommendation: Adjuvant antiestrogen therapy with tamoxifen  20 mg daily switched to anastrozole  since she is now in menopause   Anastrozole  toxicities:    Breast cancer  surveillance: Mammogram 02/09/2021: Benign postsurgical changes, density category B, patient will need a new mammogram. Breast exam 05/19/2023: Benign   Return to clinic in 1 year for follow-up ------------------------------------- Assessment and Plan Assessment & Plan Malignant neoplasm of lower-outer quadrant of right breast, estrogen receptor positive Estrogen receptor-positive breast cancer in the lower-outer quadrant of the right breast, well-managed with anastrozole  for four years. Manageable hot flashes reported as the only side effect. - Continue anastrozole  for a total of seven years. - Use nystatin powder as needed for sweating under the breast. - Provide prescription for prosthetic bra. - Encourage regular exercise, such as walking for 30 minutes daily.  Follow-up Follow-up plan established to ensure continuity of care and management of breast cancer. - Schedule follow-up appointment in one year. - Contact if any issues arise before the next scheduled appointment.      No orders of the defined types were placed in this  encounter.  The patient has a good understanding of the overall plan. she agrees with it. she will call with any problems that may develop before the next visit here. Total time spent: 30 mins including face to face time and time spent for planning, charting and co-ordination of care   Margert Sheerer, MD 05/19/23

## 2023-05-19 NOTE — Assessment & Plan Note (Signed)
 03/21/2019:Right lumpectomy Delane Fear): IDC, 2.5cm, grade 2, 4 right axillary lymph nodes negative for carcinoma.  ER 90%, PR 20%, Ki-67 2%, HER-2 negative T2N0 stage Ia Genetic testing: Negative 04/30/2019: Margin reexcision: Residual IDC 0.6 cm grade 2 Oncotype DX score: 14: Risk of recurrence: 4% Adjuvant radiation 06/14/2019-07/11/2019   Recommendation: Adjuvant antiestrogen therapy with tamoxifen  20 mg daily switched to anastrozole  since she is now in menopause   Anastrozole  toxicities:    Breast cancer surveillance: Mammogram 02/09/2021: Benign postsurgical changes, density category B, patient will need a new mammogram. Breast exam 05/19/2023: Benign   Return to clinic in 1 year for follow-up

## 2023-06-13 ENCOUNTER — Inpatient Hospital Stay: Payer: Non-veteran care | Admitting: Hematology and Oncology

## 2024-05-17 ENCOUNTER — Ambulatory Visit: Admitting: Hematology and Oncology
